# Patient Record
Sex: Female | Born: 1990 | Race: Black or African American | Hispanic: No | Marital: Married | Smoking: Never smoker
Health system: Southern US, Community
[De-identification: ages and names within clinical notes are randomized; demographics above are authoritative.]

## PROBLEM LIST (undated history)

## (undated) ENCOUNTER — Inpatient Hospital Stay (HOSPITAL_COMMUNITY): Payer: Self-pay

## (undated) DIAGNOSIS — K649 Unspecified hemorrhoids: Secondary | ICD-10-CM

## (undated) DIAGNOSIS — Z789 Other specified health status: Secondary | ICD-10-CM

## (undated) DIAGNOSIS — S0300XA Dislocation of jaw, unspecified side, initial encounter: Secondary | ICD-10-CM

## (undated) HISTORY — PX: NO PAST SURGERIES: SHX2092

---

## 2014-04-10 ENCOUNTER — Encounter (HOSPITAL_COMMUNITY): Payer: Self-pay | Admitting: *Deleted

## 2014-04-10 ENCOUNTER — Emergency Department (HOSPITAL_COMMUNITY)
Admission: EM | Admit: 2014-04-10 | Discharge: 2014-04-10 | Disposition: A | Discharge status: Home / Self Care | Payer: 59 | Source: Home / Self Care | Attending: Family Medicine | Admitting: Family Medicine

## 2014-04-10 DIAGNOSIS — K648 Other hemorrhoids: Secondary | ICD-10-CM

## 2014-04-10 DIAGNOSIS — M79604 Pain in right leg: Secondary | ICD-10-CM | POA: Diagnosis not present

## 2014-04-10 DIAGNOSIS — K644 Residual hemorrhoidal skin tags: Secondary | ICD-10-CM

## 2014-04-10 NOTE — ED Notes (Signed)
C/o R leg pain x 8 mos. off and on.  No known injury.  Also has pain in her rectum.

## 2014-04-10 NOTE — ED Provider Notes (Signed)
CSN: [id number]     Arrival date & time 04/10/14  1519 History   First MD Initiated Contact with Patient 04/10/14 1556     Chief Complaint  Patient presents with  . Foot Pain   (Consider location/radiation/quality/duration/timing/severity/associated sxs/prior Treatment) HPI Comments: Patient presents with her husband for evaluation of 8-10 months (internittent episodes) of pain that begins at her rectum and radiates to her right buttock and then travels the length of her right leg to her right foot. Denies known injury, exacerbating or alleviating factors, or attempts to treat at home. No previous evaluations. No PCP. Has lived in the [place]   The history is provided by the patient.    History reviewed. No pertinent past medical history. History reviewed. No pertinent past surgical history. History reviewed. No pertinent family history. History  Substance Use Topics  . Smoking status: Never Smoker   . Smokeless tobacco: Not on file  . Alcohol Use: No   OB History    No data available     Review of Systems  All other systems reviewed and are negative.   Allergies  Review of patient's allergies indicates no known allergies.  Home Medications   Prior to Admission medications   Not on File   BP 115/73 mmHg  Pulse 71  Temp(Src) 98.8 F (37.1 C) (Oral)  Resp 12  SpO2 99%  LMP 03/22/2014 Physical Exam  Constitutional: She is oriented to person, place, and time. She appears well-developed and well-nourished. No distress.  HENT:  Head: Normocephalic and atraumatic.  Eyes: Conjunctivae are normal.  Cardiovascular: Normal rate, regular rhythm and normal heart sounds.   Pulmonary/Chest: Effort normal and breath sounds normal.  Genitourinary:     Musculoskeletal: Normal range of motion.       Right hip: Normal.       Right knee: Normal.       Right ankle: Normal.       Lumbar back: Normal.       Right upper leg: Normal.       Right lower leg: Normal.   Right foot: Normal.  Neurological: She is alert and oriented to person, place, and time.  Skin: Skin is warm and dry.  Psychiatric: She has a normal mood and affect. Her behavior is normal.  Nursing note and vitals reviewed.   ED Course  Procedures (including critical care time) Labs Review Labs Reviewed - No data to display  Imaging Review No results found.   MDM   1. External hemorrhoid   2. Right leg pain    You have a small external hemorrhoid that does not appear to be worrisome. The remainder of your physical exam and vital signs are very reassuring. Please continue to monitor your symptoms at home and use either tylenol or ibuprofen as directed on packaging for pain. Locate the primary care doctor of your choice should symptoms continue or persist for additional evaluation.    Ria ClockJennifer Lee H Laurrie Toppin, GeorgiaPA 04/10/14 [phone]

## 2014-04-10 NOTE — Discharge Instructions (Signed)
You have a small external hemorrhoid that does not appear to be worrisome. The remainder of your physical exam and vital signs are very reassuring. Please continue to monitor your symptoms at home and use either tylenol or ibuprofen as directed on packaging for pain. Locate the primary care doctor of your choice should symptoms continue or persist for additional evaluation.  Hemorrhoids Hemorrhoids are swollen veins around the rectum or anus. There are two types of hemorrhoids:   Internal hemorrhoids. These occur in the veins just inside the rectum. They may poke through to the outside and become irritated and painful.  External hemorrhoids. These occur in the veins outside the anus and can be felt as a painful swelling or hard lump near the anus. CAUSES  Pregnancy.   Obesity.   Constipation or diarrhea.   Straining to have a bowel movement.   Sitting for long periods on the toilet.  Heavy lifting or other activity that caused you to strain.  Anal intercourse. SYMPTOMS   Pain.   Anal itching or irritation.   Rectal bleeding.   Fecal leakage.   Anal swelling.   One or more lumps around the anus.  DIAGNOSIS  Your caregiver may be able to diagnose hemorrhoids by visual examination. Other examinations or tests that may be performed include:   Examination of the rectal area with a gloved hand (digital rectal exam).   Examination of anal canal using a small tube (scope).   A blood test if you have lost a significant amount of blood.  A test to look inside the colon (sigmoidoscopy or colonoscopy). TREATMENT Most hemorrhoids can be treated at home. However, if symptoms do not seem to be getting better or if you have a lot of rectal bleeding, your caregiver may perform a procedure to help make the hemorrhoids get smaller or remove them completely. Possible treatments include:   Placing a rubber band at the base of the hemorrhoid to cut off the circulation (rubber  band ligation).   Injecting a chemical to shrink the hemorrhoid (sclerotherapy).   Using a tool to burn the hemorrhoid (infrared light therapy).   Surgically removing the hemorrhoid (hemorrhoidectomy).   Stapling the hemorrhoid to block blood flow to the tissue (hemorrhoid stapling).  HOME CARE INSTRUCTIONS   Eat foods with fiber, such as whole grains, beans, nuts, fruits, and vegetables. Ask your doctor about taking products with added fiber in them (fibersupplements).  Increase fluid intake. Drink enough water and fluids to keep your urine clear or pale yellow.   Exercise regularly.   Go to the bathroom when you have the urge to have a bowel movement. Do not wait.   Avoid straining to have bowel movements.   Keep the anal area dry and clean. Use wet toilet paper or moist towelettes after a bowel movement.   Medicated creams and suppositories may be used or applied as directed.   Only take over-the-counter or prescription medicines as directed by your caregiver.   Take warm sitz baths for 15-20 minutes, 3-4 times a day to ease pain and discomfort.   Place ice packs on the hemorrhoids if they are tender and swollen. Using ice packs between sitz baths may be helpful.   Put ice in a plastic bag.   Place a towel between your skin and the bag.   Leave the ice on for 15-20 minutes, 3-4 times a day.   Do not use a donut-shaped pillow or sit on the toilet for long periods. This  increases blood pooling and pain.  SEEK MEDICAL CARE IF:  You have increasing pain and swelling that is not controlled by treatment or medicine.  You have uncontrolled bleeding.  You have difficulty or you are unable to have a bowel movement.  You have pain or inflammation outside the area of the hemorrhoids. MAKE SURE YOU:  Understand these instructions.  Will watch your condition.  Will get help right away if you are not doing well or get worse. Document Released: 12/16/1999  Document Revised: 12/05/2011 Document Reviewed: 10/23/2011 [facility] Patient Information 2015 [place], [place]. This information is not intended to replace advice given to you by your health care provider. Make sure you discuss any questions you have with your health care provider.  Pain of Unknown Etiology (Pain Without a Known Cause) You have come to your caregiver because of pain. Pain can occur in any part of the body. Often there is not a definite cause. If your laboratory (blood or urine) work was normal and X-rays or other studies were normal, your caregiver may treat you without knowing the cause of the pain. An example of this is the headache. Most headaches are diagnosed by taking a history. This means your caregiver asks you questions about your headaches. Your caregiver determines a treatment based on your answers. Usually testing done for headaches is normal. Often testing is not done unless there is no response to medications. Regardless of where your pain is located today, you can be given medications to make you comfortable. If no physical cause of pain can be found, most cases of pain will gradually leave as suddenly as they came.  If you have a painful condition and no reason can be found for the pain, it is important that you follow up with your caregiver. If the pain becomes worse or does not go away, it may be necessary to repeat tests and look further for a possible cause.  Only take over-the-counter or prescription medicines for pain, discomfort, or fever as directed by your caregiver.  For the protection of your privacy, test results cannot be given over the phone. Make sure you receive the results of your test. Ask how these results are to be obtained if you have not been informed. It is your responsibility to obtain your test results.  You may continue all activities unless the activities cause more pain. When the pain lessens, it is important to gradually resume normal activities.  Resume activities by beginning slowly and gradually increasing the intensity and duration of the activities or exercise. During periods of severe pain, bed rest may be helpful. Lie or sit in any position that is comfortable.  Ice used for acute (sudden) conditions may be effective. Use a large plastic bag filled with ice and wrapped in a towel. This may provide pain relief.  See your caregiver for continued problems. Your caregiver can help or refer you for exercises or physical therapy if necessary. If you were given medications for your condition, do not drive, operate machinery or power tools, or sign legal documents for 24 hours. Do not drink alcohol, take sleeping pills, or take other medications that may interfere with treatment. See your caregiver immediately if you have pain that is becoming worse and not relieved by medications. Document Released: 09/12/2000 Document Revised: 10/08/2012 Document Reviewed: 12/18/2004 [facility] Patient Information 2015 [place], [place]. This information is not intended to replace advice given to you by your health care provider. Make sure you discuss any questions you have  with your health care provider. ° °

## 2014-04-14 ENCOUNTER — Ambulatory Visit (INDEPENDENT_AMBULATORY_CARE_PROVIDER_SITE_OTHER): Payer: 59 | Admitting: Physician Assistant

## 2014-04-14 VITALS — BP 110/68 | HR 79 | Temp 97.9°F | Resp 18 | Ht 61.0 in | Wt 135.0 lb

## 2014-04-14 DIAGNOSIS — M25561 Pain in right knee: Secondary | ICD-10-CM | POA: Diagnosis not present

## 2014-04-14 DIAGNOSIS — K649 Unspecified hemorrhoids: Secondary | ICD-10-CM

## 2014-04-14 MED ORDER — HYDROCORTISONE ACETATE 25 MG RE SUPP: 25.0000 mg | Freq: Two times a day (BID) | RECTAL | Status: DC | PRN

## 2014-04-14 MED ORDER — MELOXICAM 15 MG PO TABS: 15.0000 mg | ORAL_TABLET | Freq: Every day | ORAL | Status: DC | PRN

## 2014-04-14 NOTE — Patient Instructions (Signed)
Sciatica Sciatica is pain, weakness, numbness, or tingling along your sciatic nerve. The nerve starts in the lower back and runs down the back of each leg. Nerve damage or certain conditions pinch or put pressure on the sciatic nerve. This causes the pain, weakness, and other discomforts of sciatica. HOME CARE   Only take medicine as told by your doctor.  Apply ice to the affected area for 20 minutes. Do this 3-4 times a day for the first 48-72 hours. Then try heat in the same way.  Exercise, stretch, or do your usual activities if these do not make your pain worse.  Go to physical therapy as told by your doctor.  Keep all doctor visits as told.  Do not wear high heels or shoes that are not supportive.  Get a firm mattress if your mattress is too soft to lessen pain and discomfort. GET HELP RIGHT AWAY IF:   You cannot control when you poop (bowel movement) or pee (urinate).  You have more weakness in your lower back, lower belly (pelvis), butt (buttocks), or legs.  You have redness or puffiness (swelling) of your back.  You have a burning feeling when you pee.  You have pain that gets worse when you lie down.  You have pain that wakes you from your sleep.  Your pain is worse than past pain.  Your pain lasts longer than 4 weeks.  You are suddenly losing weight without reason. MAKE SURE YOU:   Understand these instructions.  Will watch this condition.  Will get help right away if you are not doing well or get worse. Document Released: 09/27/2007 Document Revised: 06/19/2011 Document Reviewed: 04/29/2011 [facility] Patient Information 2015 [place], [place]. This information is not intended to replace advice given to you by your health care provider. Make sure you discuss any questions you have with your health care provider. Hemorrhoids Hemorrhoids are puffy (swollen) veins around the rectum or anus. Hemorrhoids can cause pain, itching, bleeding, or irritation. HOME  CARE  Eat foods with fiber, such as whole grains, beans, nuts, fruits, and vegetables. Ask your doctor about taking products with added fiber in them (fibersupplements).  Drink enough fluid to keep your pee (urine) clear or pale yellow.  Exercise often.  Go to the bathroom when you have the urge to poop. Do not wait.  Avoid straining to poop (bowel movement).  Keep the butt area dry and clean. Use wet toilet paper or moist paper towels.  Medicated creams and medicine inserted into the anus (anal suppository) may be used or applied as told.  Only take medicine as told by your doctor.  Take a warm water bath (sitz bath) for 15-20 minutes to ease pain. Do this 3-4 times a day.  Place ice packs on the area if it is tender or puffy. Use the ice packs between the warm water baths.  Put ice in a plastic bag.  Place a towel between your skin and the bag.  Leave the ice on for 15-20 minutes, 03-04 times a day.  Do not use a donut-shaped pillow or sit on the toilet for a long time. GET HELP RIGHT AWAY IF:   You have more pain that is not controlled by treatment or medicine.  You have bleeding that will not stop.  You have trouble or are unable to poop (bowel movement).  You have pain or puffiness outside the area of the hemorrhoids. MAKE SURE YOU:   Understand these instructions.  Will watch your condition.  Will get help right away if you are not doing well or get worse. Document Released: 09/27/2007 Document Revised: 12/05/2011 Document Reviewed: 10/30/2011 [facility] Patient Information 2015 [place], [place]. This information is not intended to replace advice given to you by your health care provider. Make sure you discuss any questions you have with your health care provider.

## 2014-04-14 NOTE — Progress Notes (Signed)
Subjective:   Patient ID: Barth Kirks, female     DOB: 09/08/1990, 24 y.o.    MRN: [id number]  PCP: No primary care provider on file.  Chief Complaint  Patient presents with  . Hemorrhoids    8-9 mths now      Prior to Admission medications   Not on File     No Known Allergies   There are no active problems to display for this patient.    History reviewed. No pertinent family history.   History   Social History  . Marital Status: Married    Spouse Name: Shanekia Latella  . Number of Children: 1  . Years of Education: College   Occupational History  . stay-at-home mother    Social History Main Topics  . Smoking status: Never Smoker   . Smokeless tobacco: Never Used  . Alcohol Use: No  . Drug Use: No  . Sexual Activity:    Partners: Male    Birth Control/ Protection: None     Comment: trying to become pregnant (04/2014)   Other Topics Concern  . Not on file   Social History Narrative   From [place]. Arrived in the [place] 01/2014 with her infant daughter, to joint her husband, already living here.     HPI  Presents complaining of hemorrhoids and RIGHT leg pain. She is accompanied by her husband who helps translate, and their 106 month old daughter. She was seen for the same complaint at another facility on 4/09. That note is reviewed. She was diagnosed with a non-thrombosed hemorrhoid and advised that she needed no treatment. She wants to know what to do when she has pain, and also asks about the leg pain. The hemorrhoid has been present since the later part of her pregnancy. It is intermittently painful with sitting. She denies itching, pain with BM and bleeding with BM. The leg pain has been present longer, but she can't be more specific. It starts in the mid-buttock on the RIGHT and extends into the thigh and lower leg, ending just above the ankle. No weakness. No paresthesias. No loss of bowel or bladder control. No injury to the back or leg. Often worse at  night when she's lying down, but can also occur during the day. She's not sure which movements exacerbate the pain.  OTC ibuprofen resolves the pain.   Review of Systems Review of Systems  Constitutional: Negative for fever and chills.  Gastrointestinal: Positive for rectal pain (intermittently, with sitting). Negative for nausea, vomiting, abdominal pain, diarrhea, constipation, blood in stool and anal bleeding. Abdominal distention: no distension, but complains of having too much gas.  Genitourinary: Negative for dysuria, urgency and frequency.  Musculoskeletal: Negative for back pain, joint swelling, arthralgias, gait problem, neck pain and neck stiffness. Myalgias: RIGHT leg pain.  Skin: Negative for rash.  Neurological: Negative for tremors, weakness and numbness.        Objective:  Physical Exam Physical Exam  Constitutional: She is oriented to person, place, and time. She appears well-developed and well-nourished. No distress.  BP 110/68 mmHg  Pulse 79  Temp(Src) 97.9 F (36.6 C) (Oral)  Resp 18  Ht  (1.549 m)  Wt 135 lb (61.236 kg)  BMI 25.52 kg/m2  SpO2 99%  LMP 03/22/2014   Eyes: Conjunctivae are normal. No scleral icterus.  Neck: Neck supple. No thyromegaly present.  Cardiovascular: Normal rate, regular rhythm, normal heart sounds and intact distal pulses.   Pulmonary/Chest: Effort normal and breath  sounds normal.  Lymphadenopathy:    She has no cervical adenopathy.  Neurological: She is alert and oriented to person, place, and time.  Skin: Skin is warm and dry.  Psychiatric: She has a normal mood and affect. Her behavior is normal.             Assessment & Plan:  1. Hemorrhoids, unspecified hemorrhoid type Counseled on the generally benign nature of hemorrhoids, keeping the stool soft, avoiding sitting on the toilet for extended periods. Anticipatory guidance regarding painful or bleeding hemorrhoids, should that occur. Because she sometimes has  pain with sitting, Anusol HC PRN. - hydrocortisone (ANUSOL-HC) 25 MG suppository; Place 1 suppository (25 mg total) rectally 2 (two) times daily as needed for hemorrhoids or itching.  Dispense: 12 suppository; Refill: 1  2. Pain in joint, lower leg, right This sounds like sciatica. She's not having any back pain, and no concerning symptoms. She wants something prescription to take instead of ibuprofen. Trial of Meloxicam. - meloxicam (MOBIC) 15 MG tablet; Take 1 tablet (15 mg total) by mouth daily as needed for pain.  Dispense: 30 tablet; Refill: 1   Fernande Brashelle S. Nakhi Choi, PA-C Physician Assistant-Certified [facility]

## 2014-07-14 ENCOUNTER — Ambulatory Visit (INDEPENDENT_AMBULATORY_CARE_PROVIDER_SITE_OTHER): Payer: 59 | Admitting: Family Medicine

## 2014-07-14 VITALS — BP 118/62 | HR 74 | Temp 98.7°F | Resp 16 | Ht 60.0 in | Wt 132.8 lb

## 2014-07-14 DIAGNOSIS — N912 Amenorrhea, unspecified: Secondary | ICD-10-CM

## 2014-07-14 DIAGNOSIS — Z3490 Encounter for supervision of normal pregnancy, unspecified, unspecified trimester: Secondary | ICD-10-CM

## 2014-07-14 LAB — POCT URINE PREGNANCY: Preg Test, Ur: POSITIVE — AB

## 2014-07-14 NOTE — Patient Instructions (Signed)
First Trimester of Pregnancy The first trimester of pregnancy is from week 1 until the end of week 12 (months 1 through 3). A week after a sperm fertilizes an egg, the egg will implant on the wall of the uterus. This embryo will begin to develop into a baby. Genes from you and your partner are forming the baby. The female genes determine whether the baby is a boy or a girl. At 6-8 weeks, the eyes and face are formed, and the heartbeat can be seen on ultrasound. At the end of 12 weeks, all the baby's organs are formed.  Now that you are pregnant, you will want to do everything you can to have a healthy baby. Two of the most important things are to get good prenatal care and to follow your health care provider's instructions. Prenatal care is all the medical care you receive before the baby's birth. This care will help prevent, find, and treat any problems during the pregnancy and childbirth. BODY CHANGES Your body goes through many changes during pregnancy. The changes vary from woman to woman.   You may gain or lose a couple of pounds at first.  You may feel sick to your stomach (nauseous) and throw up (vomit). If the vomiting is uncontrollable, call your health care provider.  You may tire easily.  You may develop headaches that can be relieved by medicines approved by your health care provider.  You may urinate more often. Painful urination may mean you have a bladder infection.  You may develop heartburn as a result of your pregnancy.  You may develop constipation because certain hormones are causing the muscles that push waste through your intestines to slow down.  You may develop hemorrhoids or swollen, bulging veins (varicose veins).  Your breasts may begin to grow larger and become tender. Your nipples may stick out more, and the tissue that surrounds them (areola) may become darker.  Your gums may bleed and may be sensitive to brushing and flossing.  Dark spots or blotches (chloasma,  mask of pregnancy) may develop on your face. This will likely fade after the baby is born.  Your menstrual periods will stop.  You may have a loss of appetite.  You may develop cravings for certain kinds of food.  You may have changes in your emotions from day to day, such as being excited to be pregnant or being concerned that something may go wrong with the pregnancy and baby.  You may have more vivid and strange dreams.  You may have changes in your hair. These can include thickening of your hair, rapid growth, and changes in texture. Some women also have hair loss during or after pregnancy, or hair that feels dry or thin. Your hair will most likely return to normal after your baby is born. WHAT TO EXPECT AT YOUR PRENATAL VISITS During a routine prenatal visit:  You will be weighed to make sure you and the baby are growing normally.  Your blood pressure will be taken.  Your abdomen will be measured to track your baby's growth.  The fetal heartbeat will be listened to starting around week 10 or 12 of your pregnancy.  Test results from any previous visits will be discussed. Your health care provider may ask you:  How you are feeling.  If you are feeling the baby move.  If you have had any abnormal symptoms, such as leaking fluid, bleeding, severe headaches, or abdominal cramping.  If you have any questions. Other tests   that may be performed during your first trimester include:  Blood tests to find your blood type and to check for the presence of any previous infections. They will also be used to check for low iron levels (anemia) and Rh antibodies. Later in the pregnancy, blood tests for diabetes will be done along with other tests if problems develop.  Urine tests to check for infections, diabetes, or protein in the urine.  An ultrasound to confirm the proper growth and development of the baby.  An amniocentesis to check for possible genetic problems.  Fetal screens for  spina bifida and Down syndrome.  You may need other tests to make sure you and the baby are doing well. HOME CARE INSTRUCTIONS  Medicines  Follow your health care provider's instructions regarding medicine use. Specific medicines may be either safe or unsafe to take during pregnancy.  Take your prenatal vitamins as directed.  If you develop constipation, try taking a stool softener if your health care provider approves. Diet  Eat regular, well-balanced meals. Choose a variety of foods, such as meat or vegetable-based protein, fish, milk and low-fat dairy products, vegetables, fruits, and whole grain breads and cereals. Your health care provider will help you determine the amount of weight gain that is right for you.  Avoid raw meat and uncooked cheese. These carry germs that can cause birth defects in the baby.  Eating four or five small meals rather than three large meals a day may help relieve nausea and vomiting. If you start to feel nauseous, eating a few soda crackers can be helpful. Drinking liquids between meals instead of during meals also seems to help nausea and vomiting.  If you develop constipation, eat more high-fiber foods, such as fresh vegetables or fruit and whole grains. Drink enough fluids to keep your urine clear or pale yellow. Activity and Exercise  Exercise only as directed by your health care provider. Exercising will help you:  Control your weight.  Stay in shape.  Be prepared for labor and delivery.  Experiencing pain or cramping in the lower abdomen or low back is a good sign that you should stop exercising. Check with your health care provider before continuing normal exercises.  Try to avoid standing for long periods of time. Move your legs often if you must stand in one place for a long time.  Avoid heavy lifting.  Wear low-heeled shoes, and practice good posture.  You may continue to have sex unless your health care provider directs you  otherwise. Relief of Pain or Discomfort  Wear a good support bra for breast tenderness.   Take warm sitz baths to soothe any pain or discomfort caused by hemorrhoids. Use hemorrhoid cream if your health care provider approves.   Rest with your legs elevated if you have leg cramps or low back pain.  If you develop varicose veins in your legs, wear support hose. Elevate your feet for 15 minutes, 3-4 times a day. Limit salt in your diet. Prenatal Care  Schedule your prenatal visits by the twelfth week of pregnancy. They are usually scheduled monthly at first, then more often in the last 2 months before delivery.  Write down your questions. Take them to your prenatal visits.  Keep all your prenatal visits as directed by your health care provider. Safety  Wear your seat belt at all times when driving.  Make a list of emergency phone numbers, including numbers for family, friends, the hospital, and police and fire departments. General Tips    Ask your health care provider for a referral to a local prenatal education class. Begin classes no later than at the beginning of month 6 of your pregnancy.  Ask for help if you have counseling or nutritional needs during pregnancy. Your health care provider can offer advice or refer you to specialists for help with various needs.  Do not use hot tubs, steam rooms, or saunas.  Do not douche or use tampons or scented sanitary pads.  Do not cross your legs for long periods of time.  Avoid cat litter boxes and soil used by cats. These carry germs that can cause birth defects in the baby and possibly loss of the fetus by miscarriage or stillbirth.  Avoid all smoking, herbs, alcohol, and medicines not prescribed by your health care provider. Chemicals in these affect the formation and growth of the baby.  Schedule a dentist appointment. At home, brush your teeth with a soft toothbrush and be gentle when you floss. SEEK MEDICAL CARE IF:   You have  dizziness.  You have mild pelvic cramps, pelvic pressure, or nagging pain in the abdominal area.  You have persistent nausea, vomiting, or diarrhea.  You have a bad smelling vaginal discharge.  You have pain with urination.  You notice increased swelling in your face, hands, legs, or ankles. SEEK IMMEDIATE MEDICAL CARE IF:   You have a fever.  You are leaking fluid from your vagina.  You have spotting or bleeding from your vagina.  You have severe abdominal cramping or pain.  You have rapid weight gain or loss.  You vomit blood or material that looks like coffee grounds.  You are exposed to German measles and have never had them.  You are exposed to fifth disease or chickenpox.  You develop a severe headache.  You have shortness of breath.  You have any kind of trauma, such as from a fall or a car accident. Document Released: 12/12/2000 Document Revised: 05/04/2013 Document Reviewed: 10/28/2012 Exit[employer] Patient Information 2015 [employer]. This information is not intended to replace advice given to you by your health care provider. Make sure you discuss any questions you have with your health care provider.  

## 2014-07-14 NOTE — Progress Notes (Signed)
      Chief Complaint:  Chief Complaint  Patient presents with  . Pregnancy test    HPI: Dawn Torres is a 24 y.o. female [facility] complaining of lack of menstrual cycle since 05/07/1998 616. She has a 688-month-old and is breast-feeding. She is not on any birth control. She is G2PL1.   No past medical history on file. No past surgical history on file. History   Social History  . Marital Status: Married    Spouse Name: Gwenette Greetshag Pelzel  . Number of Children: 1  . Years of Education: College   Occupational History  . stay-at-home mother    Social History Main Topics  . Smoking status: Never Smoker   . Smokeless tobacco: Never Used  . Alcohol Use: No  . Drug Use: No  . Sexual Activity:    Partners: Male    Birth Control/ Protection: None     Comment: trying to become pregnant (04/2014)   Other Topics Concern  . None   Social History Narrative   From [place]. [place] in the [place] 01[place]2014 with her infant daughter, to join her husband, already living here.   No family history on file. No Known Allergies Prior to Admission medications   Not on File     ROS: The patient denies fevers, chills, night sweats, unintentional weight loss, chest pain, palpitations, wheezing, dyspnea on exertion, nausea, vomiting, abdominal pain, dysuria, hematuria, melena, numbness, weakness, or tingling.   All other systems have been reviewed and were otherwise negative with the exception of those mentioned in the HPI and as above.    PHYSICAL EXAM: Filed Vitals:   07/14/14 1216  BP: 118/62  Pulse: 74  Temp: 98.7 F (37.1 C)  Resp: 16   Body mass index is 25.94 kg/(m^2).   General: Alert, no acute distress HEENT:  Normocephalic, atraumatic, oropharynx patent. EOMI, PERRLA Cardiovascular:  Regular rate and rhythm, no rubs murmurs or gallops.  No Carotid bruits, radial pulse intact. No pedal edema.  Respiratory: Clear to auscultation bilaterally.  No wheezes, rales, or  rhonchi.  No cyanosis, no use of accessory musculature Abdominal: No organomegaly, abdomen is soft and non-tender, positive bowel sounds. No masses. Skin: No rashes. Neurologic: Facial musculature symmetric. Psychiatric: Patient acts appropriately throughout our interaction. Lymphatic: No cervical or submandibular lymphadenopathy Musculoskeletal: Gait intact. No edema, tenderness   LABS: Results for orders placed or performed in visit on 07/14/14  POCT urine pregnancy  Result Value Ref Range   Preg Test, Ur Positive (A) Negative     EKG/XRAY:   Primary read [facility]   ASSESSMENT/PLAN: Encounter Diagnoses  Name Primary?  . Amenorrhea   . Pregnant Yes   G2L1 at 9 weeks based on LMP 05/07/14 Advise to take prenatal vitamins Referred to /Fu with ob gyn   Gross sideeffects, risk and benefits, and alternatives of medications d/w patient. Patient is aware that all medications have potential sideeffects and we are unable to predict every sideeffect or drug-drug interaction that may occur.  Naseem Adler DO  07/14/2014 1:13 PM

## 2014-07-27 LAB — OB RESULTS CONSOLE HEPATITIS B SURFACE ANTIGEN: Hepatitis B Surface Ag: NEGATIVE

## 2014-07-27 LAB — OB RESULTS CONSOLE ABO/RH: RH Type: POSITIVE

## 2014-07-27 LAB — OB RESULTS CONSOLE HIV ANTIBODY (ROUTINE TESTING): HIV: NONREACTIVE

## 2014-07-27 LAB — OB RESULTS CONSOLE RPR: RPR: NONREACTIVE

## 2014-07-27 LAB — OB RESULTS CONSOLE GC/CHLAMYDIA
CHLAMYDIA, DNA PROBE: NEGATIVE
GC PROBE AMP, GENITAL: NEGATIVE

## 2014-07-27 LAB — OB RESULTS CONSOLE RUBELLA ANTIBODY, IGM: RUBELLA: IMMUNE

## 2014-09-22 ENCOUNTER — Other Ambulatory Visit (HOSPITAL_COMMUNITY): Payer: Self-pay | Admitting: Family Medicine

## 2014-09-22 DIAGNOSIS — O283 Abnormal ultrasonic finding on antenatal screening of mother: Secondary | ICD-10-CM

## 2014-09-22 DIAGNOSIS — Z3A21 21 weeks gestation of pregnancy: Secondary | ICD-10-CM

## 2014-10-01 ENCOUNTER — Encounter (HOSPITAL_COMMUNITY): Payer: Self-pay

## 2014-10-01 ENCOUNTER — Ambulatory Visit (HOSPITAL_COMMUNITY)
Admission: RE | Admit: 2014-10-01 | Discharge: 2014-10-01 | Disposition: A | Discharge status: Home / Self Care | Payer: 59 | Source: Ambulatory Visit | Attending: Family Medicine | Admitting: Family Medicine

## 2014-10-01 DIAGNOSIS — O283 Abnormal ultrasonic finding on antenatal screening of mother: Secondary | ICD-10-CM | POA: Diagnosis present

## 2014-10-01 DIAGNOSIS — Z3A21 21 weeks gestation of pregnancy: Secondary | ICD-10-CM | POA: Insufficient documentation

## 2014-10-01 HISTORY — DX: Other specified health status: Z78.9

## 2014-10-12 ENCOUNTER — Encounter (HOSPITAL_COMMUNITY): Payer: Self-pay | Admitting: Family Medicine

## 2014-10-12 ENCOUNTER — Other Ambulatory Visit (HOSPITAL_COMMUNITY): Payer: Self-pay | Admitting: Family Medicine

## 2014-11-12 ENCOUNTER — Other Ambulatory Visit (HOSPITAL_COMMUNITY): Payer: Self-pay | Admitting: Maternal and Fetal Medicine

## 2014-11-12 ENCOUNTER — Ambulatory Visit (HOSPITAL_COMMUNITY): Admission: RE | Admit: 2014-11-12 | Payer: 59 | Source: Ambulatory Visit

## 2014-11-12 ENCOUNTER — Encounter (HOSPITAL_COMMUNITY): Payer: Self-pay

## 2014-11-12 DIAGNOSIS — O283 Abnormal ultrasonic finding on antenatal screening of mother: Secondary | ICD-10-CM

## 2014-11-12 DIAGNOSIS — Z3A27 27 weeks gestation of pregnancy: Secondary | ICD-10-CM

## 2014-12-15 ENCOUNTER — Emergency Department (INDEPENDENT_AMBULATORY_CARE_PROVIDER_SITE_OTHER)
Admission: EM | Admit: 2014-12-15 | Discharge: 2014-12-15 | Disposition: A | Discharge status: Home / Self Care | Payer: Medicaid Other | Source: Home / Self Care | Attending: Family Medicine | Admitting: Family Medicine

## 2014-12-15 ENCOUNTER — Encounter (HOSPITAL_COMMUNITY): Payer: Self-pay | Admitting: *Deleted

## 2014-12-15 DIAGNOSIS — J069 Acute upper respiratory infection, unspecified: Secondary | ICD-10-CM

## 2014-12-15 MED ORDER — IPRATROPIUM BROMIDE 0.06 % NA SOLN: 2.0000 | Freq: Four times a day (QID) | NASAL | Status: DC

## 2014-12-15 NOTE — Discharge Instructions (Signed)
Drink plenty of fluids as discussed, use medicine as prescribed, and mucinex or delsym for cough. Return or see your doctor if further problems °

## 2014-12-15 NOTE — ED Provider Notes (Signed)
CSN: [id number]     Arrival date & time 12/15/14  1930 History   First MD Initiated Contact with Patient 12/15/14 2014     Chief Complaint  Patient presents with  . URI   (Consider location/radiation/quality/duration/timing/severity/associated sxs/prior Treatment) Patient is a 24 y.o. female presenting with URI. The history is provided by the patient.  URI Presenting symptoms: congestion, cough and rhinorrhea   Presenting symptoms: no fever and no sore throat   Severity:  Mild Onset quality:  Gradual Duration:  2 days Progression:  Unchanged Chronicity:  New Relieved by:  None tried Worsened by:  Nothing tried Ineffective treatments:  None tried Risk factors comment:  30 wk preg   Past Medical History  Diagnosis Date  . Medical history non-contributory    Past Surgical History  Procedure Laterality Date  . No past surgeries     History reviewed. No pertinent family history. Social History  Substance Use Topics  . Smoking status: Never Smoker   . Smokeless tobacco: Never Used  . Alcohol Use: No   OB History    Gravida Para Term Preterm AB TAB SAB Ectopic Multiple Living   2 1 1  0 0 0 0 0 0 1     Review of Systems  Constitutional: Negative.  Negative for fever.  HENT: Positive for congestion, postnasal drip and rhinorrhea. Negative for sore throat.   Respiratory: Positive for cough.   Cardiovascular: Negative.   All other systems reviewed and are negative.   Allergies  Review of patient's allergies indicates no known allergies.  Home Medications   Prior to Admission medications   Medication Sig Start Date End Date Taking? Authorizing Provider  ipratropium (ATROVENT) 0.06 % nasal spray Place 2 sprays into both nostrils 4 (four) times daily. 12/15/14   Linna HoffJames D Linden Tagliaferro, MD  Prenatal Vit-Fe Fumarate-FA (PRENATAL VITAMIN PO) Take by mouth.    Historical Provider, MD   Meds Ordered and Administered this Visit  Medications - No data to display  BP 115/77 mmHg   Pulse 106  Temp(Src) 99.1 F (37.3 C) (Oral)  Resp 16  SpO2 97%  LMP 05/07/2014 No data found.   Physical Exam  Constitutional: She is oriented to person, place, and time. She appears well-developed and well-nourished. No distress.  HENT:  Right Ear: External ear normal.  Left Ear: External ear normal.  Mouth/Throat: Oropharynx is clear and moist.  Neck: Normal range of motion. Neck supple.  Cardiovascular: Regular rhythm.   Pulmonary/Chest: Effort normal and breath sounds normal.  Lymphadenopathy:    She has no cervical adenopathy.  Neurological: She is alert and oriented to person, place, and time.  Skin: Skin is warm and dry.  Nursing note and vitals reviewed.   ED Course  Procedures (including critical care time)  Labs Review Labs Reviewed - No data to display  Imaging Review No results found.   Visual Acuity Review  Right Eye Distance:   Left Eye Distance:   Bilateral Distance:    Right Eye Near:   Left Eye Near:    Bilateral Near:         MDM   1. URI (upper respiratory infection)        Linna HoffJames D Tatjana Turcott, MD 12/15/14 2046

## 2014-12-15 NOTE — ED Notes (Signed)
Pt  Reports      Symptoms  Of  Cough   Congested         Sinus  Drainage        Pt    Is       [redacted]  Weeks  Pregnant           Symptoms  Not  releived  By  otc  mucinex

## 2015-01-02 NOTE — L&D Delivery Note (Signed)
Delivery Note At 4:39 PM a viable female, "[facility]", was delivered via Vaginal, Spontaneous Delivery (Presentation: ; Occiput Anterior).  APGAR: 9, 9; weight  .   Placenta status: Intact, Spontaneous.  Cord: 3 vessels with the following complications: None.  Cord pH: NA  Anesthesia: Epidural  Episiotomy: Median, due to patient difficulty with pushing in latter 2nd stage. Lacerations: None Suture Repair: 3.0 vicryl Est. Blood Loss (mL):  254  Mom to postpartum.  Baby to Couplet care / Skin to Skin. Family desires outpatient circumcision. Patient undecided regarding contraception.  Nigel Bridgeman 02/10/2015, 5:05 PM

## 2015-01-17 LAB — OB RESULTS CONSOLE GBS: GBS: NEGATIVE

## 2015-02-07 ENCOUNTER — Inpatient Hospital Stay (HOSPITAL_COMMUNITY)
Admission: AD | Admit: 2015-02-07 | Discharge: 2015-02-07 | Disposition: A | Discharge status: Home / Self Care | Payer: Medicaid Other | Source: Ambulatory Visit | Attending: Obstetrics & Gynecology | Admitting: Obstetrics & Gynecology

## 2015-02-07 ENCOUNTER — Encounter (HOSPITAL_COMMUNITY): Payer: Self-pay | Admitting: *Deleted

## 2015-02-07 DIAGNOSIS — N898 Other specified noninflammatory disorders of vagina: Secondary | ICD-10-CM | POA: Insufficient documentation

## 2015-02-07 DIAGNOSIS — M549 Dorsalgia, unspecified: Secondary | ICD-10-CM | POA: Insufficient documentation

## 2015-02-07 DIAGNOSIS — O99891 Other specified diseases and conditions complicating pregnancy: Secondary | ICD-10-CM

## 2015-02-07 DIAGNOSIS — O26893 Other specified pregnancy related conditions, third trimester: Secondary | ICD-10-CM | POA: Diagnosis not present

## 2015-02-07 DIAGNOSIS — Z3A39 39 weeks gestation of pregnancy: Secondary | ICD-10-CM | POA: Diagnosis not present

## 2015-02-07 DIAGNOSIS — O479 False labor, unspecified: Secondary | ICD-10-CM

## 2015-02-07 DIAGNOSIS — O9989 Other specified diseases and conditions complicating pregnancy, childbirth and the puerperium: Secondary | ICD-10-CM

## 2015-02-07 DIAGNOSIS — N939 Abnormal uterine and vaginal bleeding, unspecified: Secondary | ICD-10-CM | POA: Diagnosis present

## 2015-02-07 LAB — URINALYSIS, ROUTINE W REFLEX MICROSCOPIC
BILIRUBIN URINE: NEGATIVE
GLUCOSE, UA: NEGATIVE mg/dL
Hgb urine dipstick: NEGATIVE
KETONES UR: NEGATIVE mg/dL
LEUKOCYTES UA: NEGATIVE
Nitrite: NEGATIVE
PH: 5.5 (ref 5.0–8.0)
PROTEIN: NEGATIVE mg/dL
Specific Gravity, Urine: <1.005 — ABNORMAL LOW (ref 1.005–1.030)

## 2015-02-07 NOTE — MAU Note (Signed)
Urine sent to lab @2130 

## 2015-02-07 NOTE — MAU Note (Signed)
Pt states she had a one inch piece of something yellow/brown come out of her vagina three times since yesterday. Denies any leaking or vag bleeding. C/O low back pain for past two days, intermittent low abd pain and upper thigh pain .  Reports baby moving well.

## 2015-02-07 NOTE — MAU Provider Note (Signed)
History    Dawn Torres is a 25 y.o. G2P1001 at 39.3wks who presents, unannounced, for vaginal discharge and back pain.  Patient is non-english speaking and husband reports she had "strip of brownish yellow discharge" from her vagina.  No reports of VB or LOF, while endorsement of fetal movement given.  Patient husband reports patient has been having back pain for days and contractions since office visit.  Husband also reports patient was 2cm in office.  Per office note, patient 3/80/0 and had her membranes swept.  No complaints at that time.   There are no active problems to display for this patient.   No chief complaint on file.  HPI  OB History    Gravida Para Term Preterm AB TAB SAB Ectopic Multiple Living   0 0 0 0 0 0 1      Past Medical History  Diagnosis Date  . Medical history non-contributory     Past Surgical History  Procedure Laterality Date  . No past surgeries      History reviewed. No pertinent family history.  Social History  Substance Use Topics  . Smoking status: Never Smoker   . Smokeless tobacco: Never Used  . Alcohol Use: No    Allergies: No Known Allergies  Prescriptions prior to admission  Medication Sig Dispense Refill Last Dose  . Prenatal Vit-Fe Fumarate-FA (PRENATAL MULTIVITAMIN) TABS tablet Take 1 tablet by mouth daily at 12 noon.   02/06/2015 at Unknown time  . ipratropium (ATROVENT) 0.06 % nasal spray Place 2 sprays into both nostrils 4 (four) times daily. (Patient not taking: Reported on 02/07/2015) 15 mL 1 Not Taking at Unknown time    ROS  See HPI Above Physical Exam   Last menstrual period 05/07/2014.  No results found for this or any previous visit (from the past 24 hour(s)).  Physical Exam SVE: 3/70/-2 Apparent mucoid discharge consistent with mucous plug  FHR:125 bpm, Mod Var, -Decels, +Accels UC: Q2-75min, palpates mild to moderate ED Course  Assessment: IUP at 39.3wks Cat I FT Contractions Back Pain Vaginal  Discharge  Plan: -Discussed mucous discharge and normalcy in pregnancy -Patient denies need for pain medication regarding back pain -Given Early Labor Options:  A: Home with or w/o therapeutic rest B: Ambulate and reassess C: Pain medication and reassess -Patient undecided -Instructed to communicate decision to nurse -Give water for oral hydration  Follow Up (2222) -Nurse call reports patient desires to go home -Discharge to home as requested -Nurse instructed to give labor precautions and office number for provider contact  Cherre Robins CNM, MSN 02/07/2015 10:17 PM

## 2015-02-07 NOTE — MAU Note (Signed)
Language Line Used ID: [phone]

## 2015-02-10 ENCOUNTER — Inpatient Hospital Stay (HOSPITAL_COMMUNITY): Payer: Medicaid Other | Admitting: Anesthesiology

## 2015-02-10 ENCOUNTER — Encounter (HOSPITAL_COMMUNITY): Payer: Self-pay | Admitting: *Deleted

## 2015-02-10 ENCOUNTER — Inpatient Hospital Stay (HOSPITAL_COMMUNITY)
Admission: AD | Admit: 2015-02-10 | Discharge: 2015-02-12 | DRG: 775 | Disposition: A | Discharge status: Home / Self Care | Payer: Medicaid Other | Source: Ambulatory Visit | Attending: Obstetrics and Gynecology | Admitting: Obstetrics and Gynecology

## 2015-02-10 DIAGNOSIS — O9081 Anemia of the puerperium: Principal | ICD-10-CM | POA: Diagnosis not present

## 2015-02-10 DIAGNOSIS — Z789 Other specified health status: Secondary | ICD-10-CM | POA: Diagnosis present

## 2015-02-10 DIAGNOSIS — D649 Anemia, unspecified: Secondary | ICD-10-CM

## 2015-02-10 DIAGNOSIS — Z758 Other problems related to medical facilities and other health care: Secondary | ICD-10-CM | POA: Diagnosis present

## 2015-02-10 DIAGNOSIS — Z3A39 39 weeks gestation of pregnancy: Secondary | ICD-10-CM

## 2015-02-10 LAB — CBC
HEMATOCRIT: 34.9 % — AB (ref 36.0–46.0)
HEMOGLOBIN: 11.1 g/dL — AB (ref 12.0–15.0)
MCH: 24.9 pg — ABNORMAL LOW (ref 26.0–34.0)
MCHC: 31.8 g/dL (ref 30.0–36.0)
MCV: 78.3 fL (ref 78.0–100.0)
Platelets: 156 10*3/uL (ref 150–400)
RBC: 4.46 MIL/uL (ref 3.87–5.11)
RDW: 16.6 % — ABNORMAL HIGH (ref 11.5–15.5)
WBC: 5.3 10*3/uL (ref 4.0–10.5)

## 2015-02-10 LAB — TYPE AND SCREEN
ABO/RH(D): B POS
ANTIBODY SCREEN: NEGATIVE

## 2015-02-10 LAB — ABO/RH: ABO/RH(D): B POS

## 2015-02-10 MED ORDER — PHENYLEPHRINE 40 MCG/ML (10ML) SYRINGE FOR IV PUSH (FOR BLOOD PRESSURE SUPPORT)
80.0000 ug | PREFILLED_SYRINGE | INTRAVENOUS | Status: DC | PRN
  Administered 2015-02-10 (×2): 80 ug via INTRAVENOUS
  Filled 2015-02-10: qty 2

## 2015-02-10 MED ORDER — ONDANSETRON HCL 4 MG/2ML IJ SOLN: 4.0000 mg | INTRAMUSCULAR | Status: DC | PRN

## 2015-02-10 MED ORDER — PHENYLEPHRINE 40 MCG/ML (10ML) SYRINGE FOR IV PUSH (FOR BLOOD PRESSURE SUPPORT)
PREFILLED_SYRINGE | INTRAVENOUS | Status: AC
  Filled 2015-02-10: qty 20

## 2015-02-10 MED ORDER — SIMETHICONE 80 MG PO CHEW: 80.0000 mg | CHEWABLE_TABLET | ORAL | Status: DC | PRN

## 2015-02-10 MED ORDER — TETANUS-DIPHTH-ACELL PERTUSSIS 5-2.5-18.5 LF-MCG/0.5 IM SUSP: 0.5000 mL | Freq: Once | INTRAMUSCULAR | Status: DC

## 2015-02-10 MED ORDER — FENTANYL CITRATE (PF) 100 MCG/2ML IJ SOLN
100.0000 ug | INTRAMUSCULAR | Status: DC | PRN
  Administered 2015-02-10: 100 ug via INTRAVENOUS
  Filled 2015-02-10: qty 2

## 2015-02-10 MED ORDER — ONDANSETRON HCL 4 MG/2ML IJ SOLN: 4.0000 mg | Freq: Four times a day (QID) | INTRAMUSCULAR | Status: DC | PRN

## 2015-02-10 MED ORDER — EPHEDRINE 5 MG/ML INJ
10.0000 mg | INTRAVENOUS | Status: DC | PRN
  Filled 2015-02-10: qty 2

## 2015-02-10 MED ORDER — LIDOCAINE HCL (PF) 1 % IJ SOLN
30.0000 mL | INTRAMUSCULAR | Status: DC | PRN
  Administered 2015-02-10: 30 mL via SUBCUTANEOUS
  Filled 2015-02-10: qty 30

## 2015-02-10 MED ORDER — ONDANSETRON HCL 4 MG PO TABS: 4.0000 mg | ORAL_TABLET | ORAL | Status: DC | PRN

## 2015-02-10 MED ORDER — DIPHENHYDRAMINE HCL 25 MG PO CAPS: 25.0000 mg | ORAL_CAPSULE | Freq: Four times a day (QID) | ORAL | Status: DC | PRN

## 2015-02-10 MED ORDER — OXYCODONE HCL 5 MG PO TABS
5.0000 mg | ORAL_TABLET | ORAL | Status: DC | PRN
  Administered 2015-02-11 (×2): 5 mg via ORAL
  Filled 2015-02-10 (×2): qty 1

## 2015-02-10 MED ORDER — OXYCODONE-ACETAMINOPHEN 5-325 MG PO TABS: 1.0000 | ORAL_TABLET | ORAL | Status: DC | PRN

## 2015-02-10 MED ORDER — FENTANYL 2.5 MCG/ML BUPIVACAINE 1/10 % EPIDURAL INFUSION (WH - ANES)
14.0000 mL/h | INTRAMUSCULAR | Status: DC | PRN
  Administered 2015-02-10: 14 mL/h via EPIDURAL

## 2015-02-10 MED ORDER — SENNOSIDES-DOCUSATE SODIUM 8.6-50 MG PO TABS
2.0000 | ORAL_TABLET | ORAL | Status: DC
  Administered 2015-02-10 – 2015-02-11 (×2): 2 via ORAL
  Filled 2015-02-10 (×2): qty 2

## 2015-02-10 MED ORDER — FLEET ENEMA 7-19 GM/118ML RE ENEM: 1.0000 | ENEMA | RECTAL | Status: DC | PRN

## 2015-02-10 MED ORDER — LACTATED RINGERS IV SOLN
INTRAVENOUS | Status: DC
  Administered 2015-02-10 (×2): via INTRAVENOUS

## 2015-02-10 MED ORDER — PRENATAL MULTIVITAMIN CH
1.0000 | ORAL_TABLET | Freq: Every day | ORAL | Status: DC
  Administered 2015-02-11 – 2015-02-12 (×2): 1 via ORAL
  Filled 2015-02-10 (×2): qty 1

## 2015-02-10 MED ORDER — LACTATED RINGERS IV SOLN
2.5000 [IU]/h | INTRAVENOUS | Status: DC
  Filled 2015-02-10: qty 4

## 2015-02-10 MED ORDER — IBUPROFEN 600 MG PO TABS
600.0000 mg | ORAL_TABLET | Freq: Four times a day (QID) | ORAL | Status: DC
  Administered 2015-02-10 – 2015-02-12 (×7): 600 mg via ORAL
  Filled 2015-02-10 (×7): qty 1

## 2015-02-10 MED ORDER — BENZOCAINE-MENTHOL 20-0.5 % EX AERO: 1.0000 "application " | INHALATION_SPRAY | CUTANEOUS | Status: DC | PRN

## 2015-02-10 MED ORDER — LACTATED RINGERS IV SOLN: 500.0000 mL | INTRAVENOUS | Status: DC | PRN

## 2015-02-10 MED ORDER — DIBUCAINE 1 % RE OINT: 1.0000 "application " | TOPICAL_OINTMENT | RECTAL | Status: DC | PRN

## 2015-02-10 MED ORDER — ACETAMINOPHEN 325 MG PO TABS: 650.0000 mg | ORAL_TABLET | ORAL | Status: DC | PRN

## 2015-02-10 MED ORDER — OXYCODONE-ACETAMINOPHEN 5-325 MG PO TABS: 2.0000 | ORAL_TABLET | ORAL | Status: DC | PRN

## 2015-02-10 MED ORDER — WITCH HAZEL-GLYCERIN EX PADS: 1.0000 "application " | MEDICATED_PAD | CUTANEOUS | Status: DC | PRN

## 2015-02-10 MED ORDER — LANOLIN HYDROUS EX OINT: TOPICAL_OINTMENT | CUTANEOUS | Status: DC | PRN

## 2015-02-10 MED ORDER — ZOLPIDEM TARTRATE 5 MG PO TABS: 5.0000 mg | ORAL_TABLET | Freq: Every evening | ORAL | Status: DC | PRN

## 2015-02-10 MED ORDER — FENTANYL 2.5 MCG/ML BUPIVACAINE 1/10 % EPIDURAL INFUSION (WH - ANES)
INTRAMUSCULAR | Status: AC
  Filled 2015-02-10: qty 125

## 2015-02-10 MED ORDER — OXYTOCIN BOLUS FROM INFUSION
500.0000 mL | INTRAVENOUS | Status: DC
  Administered 2015-02-10: 500 mL via INTRAVENOUS

## 2015-02-10 MED ORDER — OXYCODONE HCL 5 MG PO TABS: 10.0000 mg | ORAL_TABLET | ORAL | Status: DC | PRN

## 2015-02-10 MED ORDER — DIPHENHYDRAMINE HCL 50 MG/ML IJ SOLN: 12.5000 mg | INTRAMUSCULAR | Status: DC | PRN

## 2015-02-10 MED ORDER — LIDOCAINE HCL (PF) 1 % IJ SOLN
INTRAMUSCULAR | Status: DC | PRN
  Administered 2015-02-10: 5 mL via EPIDURAL
  Administered 2015-02-10: 3 mL via EPIDURAL
  Administered 2015-02-10: 2 mL via EPIDURAL

## 2015-02-10 MED ORDER — ACETAMINOPHEN 325 MG PO TABS
650.0000 mg | ORAL_TABLET | ORAL | Status: DC | PRN
  Administered 2015-02-11 (×3): 650 mg via ORAL
  Filled 2015-02-10 (×3): qty 2

## 2015-02-10 MED ORDER — CITRIC ACID-SODIUM CITRATE 334-500 MG/5ML PO SOLN: 30.0000 mL | ORAL | Status: DC | PRN

## 2015-02-10 NOTE — Progress Notes (Signed)
LEAD discussed with Mom via interpreter line. Mom states she understands risk of giving formula but still wants to breastfeed and bottle feed. She states she breastfeed her 41 month old for 13 months and started off doing both with no problems.

## 2015-02-10 NOTE — Anesthesia Procedure Notes (Signed)
Epidural Patient location during procedure: OB  Staffing Anesthesiologist: Fitzpatrick Alberico EDWARD Performed by: anesthesiologist   Preanesthetic Checklist Completed: patient identified, pre-op evaluation, timeout performed, IV checked, risks and benefits discussed and monitors and equipment checked  Epidural Patient position: sitting Prep: DuraPrep Patient monitoring: blood pressure and continuous pulse ox Approach: midline Location: L3-L4 Injection technique: LOR air  Needle:  Needle type: Tuohy  Needle gauge: 17 G Needle length: 9 cm Needle insertion depth: 5 cm Catheter size: 19 Gauge Catheter at skin depth: 10 cm Test dose: negative and Other (1% Lidocaine)  Additional Notes Patient identified.  Risk benefits discussed including failed block, incomplete pain control, headache, nerve damage, paralysis, blood pressure changes, nausea, vomiting, reactions to medication both toxic or allergic, and postpartum back pain.  Patient expressed understanding and wished to proceed.  All questions were answered.  Sterile technique used throughout procedure and epidural site dressed with sterile barrier dressing. No paresthesia or other complications noted. The patient did not experience any signs of intravascular injection such as tinnitus or metallic taste in mouth nor signs of intrathecal spread such as rapid motor block. Please see nursing notes for vital signs. Reason for block:procedure for pain   

## 2015-02-10 NOTE — MAU Note (Addendum)
Pt sent here from OB to be admitted.  Started having contractions last night, denies LOF or bleeding.

## 2015-02-10 NOTE — H&P (Signed)
Dawn Torres is a 25 y.o. female, G2P1001 at 6 6/7 weeks, presenting from the office for active labor, cervix 4-5, 90%, vtx, 0 station, BBOW by Dr. Cloretta Ned exam, with UCs q 5-8 min.  Patient Active Problem List   Diagnosis Date Noted  . Normal labor 02/10/2015  . Language barrier 02/10/2015  Echogenic foci in abdomen noted on [place].  History of present pregnancy: Patient entered care at 70 4/7 weeks.   EDC of 02/11/15 was established by LMP.   Anatomy scan: EFW 283g, linear, appear normal EXCEPT ABDOMEN ECHOGENIC FOCI measuring 3.5 mm.   Additional [place] evaluations:  26 6/7 weeks:  EFW: 928g (43%), good interval growth, LUQ abdomen echogenic focus still present (measurements: 0.42mm, 0.45cm, now 0.592cm). . 37 6/7 weeks:  U/S: Singleton pregnancy. Vertex presentation. Posterior placenta. Fluid is normal-AFI=20th%. EFW= 2967g/6lbs 9oz (46th% lagging).   Significant prenatal events:   MFM consult: MFM 9/30: ISOLATED ECHOGENIC FOCI, FOLLOW UP IN 6 WEEKS, IF WITH ADDITIONAL FOCI OR INCREASE IN SIZE THEN DO VIRAL STUDIES. Last evaluation:  Today--cervix 4-5 cm, 90%, vtx, 0 station, BBOW by Dr. Cloretta Ned exam.  OB History    Gravida Para Term Preterm AB TAB SAB Ectopic Multiple Living   0 0 0 0 0 0 1    2015--SVB at term, in [place]  Past Medical History  Diagnosis Date  . Medical history non-contributory    Past Surgical History  Procedure Laterality Date  . No past surgeries     Family History: Non-contributory  Social History:  reports that she has never smoked. She has never used smokeless tobacco. She reports that she does not drink alcohol or use illicit drugs. Patient is from [place], here in [place] x 1 year.  Married, with husband, Jolanta Cabeza, present and supportive.     Prenatal Transfer Tool  Maternal Diabetes: No Genetic Screening: Normal Quad screen Maternal Ultrasounds/Referrals: Abnormal:  Findings:   Other: EIF in fetal abdomen Fetal Ultrasounds or other Referrals:   Referred to Materal Fetal Medicine  Maternal Substance Abuse:  No Significant Maternal Medications:  None Significant Maternal Lab Results: Lab values include: Group B Strep negative  TDAP 11/22/14 Flu 10/27/15  ROS:  Contractions, +FM  No Known Allergies   Dilation: 8.5 Effacement (%): 100 Exam by:: Vicky CNM Blood pressure 92/49, pulse 79, temperature 98 F (36.7 C), temperature source Oral, resp. rate 24, last menstrual period 05/07/2014.  Chest clear Heart RRR without murmur Abd gravid, NT, FH 39 cm Pelvic: 4-5 cm prior to arrival, as above on my first evaluation of patient. Ext: WNL  FHR: Category 1 now--had segments of variables and lates while on back s/p epidural.  Now resolved. UCs:  q 3-5 min, moderate  Prenatal labs: ABO, Rh: --/--/B POS, B POS (02/09 1310) Antibody: NEG (02/09 1310) Rubella:  Immune RPR: Nonreactive (07/26 0000)  HBsAg: Negative (07/26 0000)  HIV: Non-reactive (07/26 0000)  GBS: Negative (01/16 0000) Sickle cell/Hgb electrophoresis:  AA Pap:  WNL 08/2014 GC:  Negative 07/27/14 Chlamydia:  Negative 07/27/14 Genetic screenings:  Declined Glucola:  WNL Other:   Hgb at 12.0  NOB, 11.1 at 28 weeks       Assessment/Plan: IUP at 39 6/7 weeks Active labor GBS negative Moderate language barrier  Plan: Admit to Birthing Suite per consult with Dr. Normand Sloop Routine [facility] orders Pain med/epidural prn Language line as needed.  Samaiyah Howes, VICKICNM, [place] 02/10/2015, 3:41 PM

## 2015-02-10 NOTE — Anesthesia Preprocedure Evaluation (Signed)
Anesthesia Evaluation  Patient identified by MRN, date of birth, ID band Patient awake    Reviewed: Allergy & Precautions, NPO status , Patient's Chart, lab work & pertinent test results  History of Anesthesia Complications Negative for: history of anesthetic complications  Airway Mallampati: II  TM Distance: >3 FB Neck ROM: Full    Dental  (+) Teeth Intact, Dental Advisory Given   Pulmonary neg pulmonary ROS,    Pulmonary exam normal breath sounds clear to auscultation       Cardiovascular negative cardio ROS Normal cardiovascular exam Rhythm:Regular Rate:Normal     Neuro/Psych negative neurological ROS  negative psych ROS   GI/Hepatic negative GI ROS, Neg liver ROS,   Endo/Other  negative endocrine ROS  Renal/GU negative Renal ROS     Musculoskeletal negative musculoskeletal ROS (+)   Abdominal   Peds  Hematology  (+) Blood dyscrasia, anemia , Plt 156k    Anesthesia Other Findings Day of surgery medications reviewed with the patient.  Reproductive/Obstetrics (+) Pregnancy                             Anesthesia Physical Anesthesia Plan  ASA: II  Anesthesia Plan: Epidural   Post-op Pain Management:    Induction:   Airway Management Planned:   Additional Equipment:   Intra-op Plan:   Post-operative Plan:   Informed Consent: I have reviewed the patients History and Physical, chart, labs and discussed the procedure including the risks, benefits and alternatives for the proposed anesthesia with the patient or authorized representative who has indicated his/her understanding and acceptance.   Dental advisory given  Plan Discussed with:   Anesthesia Plan Comments: (Patient identified. Risks/Benefits/Options discussed with patient including but not limited to bleeding, infection, nerve damage, paralysis, failed block, incomplete pain control, headache, blood pressure changes,  nausea, vomiting, reactions to medication both or allergic, itching and postpartum back pain. Confirmed with bedside nurse the patient's most recent platelet count. Confirmed with patient that they are not currently taking any anticoagulation, have any bleeding history or any family history of bleeding disorders. Patient expressed understanding and wished to proceed. All questions were answered.   Online arabic interpreter utilized throughout entire patient encounter.)        Anesthesia Quick Evaluation

## 2015-02-10 NOTE — Progress Notes (Signed)
  Subjective: Comfortable now with epidural.  Husband at bedside.  Objective: BP 92/49 mmHg  Pulse 79  Temp(Src) 98 F (36.7 C) (Oral)  Resp 24  LMP 05/07/2014      FHT: Category 1 UC:   regular, every 4-5 minutes SVE:   Dilation: 8.5 Effacement (%): 100 Exam by:: Vicky CNM  BBOW--AROM, clear fluid. FSE applied without difficulty   Assessment:  Transitional labor GBS negative  Plan: Continue current care Await urge to push.  Nigel Bridgeman CNM 02/10/2015, 3:55 PM

## 2015-02-10 NOTE — Progress Notes (Signed)
Interpreter line called for education, infant safety and pt fall safety done. Interpreter #[id number] used as interpreter at this time. Pt states she and husband understands some english and can tell us if they need us to call interpreter line for translation.            

## 2015-02-11 LAB — CBC
HEMATOCRIT: 32.4 % — AB (ref 36.0–46.0)
Hemoglobin: 10.2 g/dL — ABNORMAL LOW (ref 12.0–15.0)
MCH: 24.9 pg — ABNORMAL LOW (ref 26.0–34.0)
MCHC: 31.5 g/dL (ref 30.0–36.0)
MCV: 79 fL (ref 78.0–100.0)
PLATELETS: 162 10*3/uL (ref 150–400)
RBC: 4.1 MIL/uL (ref 3.87–5.11)
RDW: 16.7 % — AB (ref 11.5–15.5)
WBC: 9.1 10*3/uL (ref 4.0–10.5)

## 2015-02-11 LAB — RPR: RPR Ser Ql: NONREACTIVE

## 2015-02-11 LAB — HIV ANTIBODY (ROUTINE TESTING W REFLEX): HIV SCREEN 4TH GENERATION: NONREACTIVE

## 2015-02-11 NOTE — Anesthesia Postprocedure Evaluation (Signed)
Anesthesia Post Note  Patient: Dawn Torres  Procedure(s) Performed: * No procedures listed *  Patient location during evaluation: Mother Baby Anesthesia Type: Epidural Level of consciousness: awake and alert and oriented Pain management: pain level controlled Vital Signs Assessment: post-procedure vital signs reviewed and stable Respiratory status: spontaneous breathing Cardiovascular status: blood pressure returned to baseline Postop Assessment: no headache, no backache, patient able to bend at knees, no signs of nausea or vomiting and adequate PO intake Anesthetic complications: no    Last Vitals:  Filed Vitals:   02/11/15 0000 02/11/15 0505  BP: 106/57 92/49  Pulse: 67 64  Temp: 37.1 C 36.8 C  Resp: 18 20    Last Pain:  Filed Vitals:   02/11/15 0535  PainSc: 0-No pain                 Braedan Meuth

## 2015-02-11 NOTE — Progress Notes (Signed)
Subjective: Postpartum Day 1: Vaginal delivery, Median episiotomy  Patient up ad lib, reports no syncope or dizziness. Feeding:  Breast Contraceptive plan:  Not discussed  Objective: Vital signs in last 24 hours: Temp:  [98 F (36.7 C)-98.9 F (37.2 C)] 98.3 F (36.8 C) (02/10 0901) Pulse Rate:  [64-121] 98 (02/10 0901) Resp:  [16-24] 18 (02/10 0901) BP: (87-127)/(35-81) 105/72 mmHg (02/10 0901) SpO2:  [100 %] 100 % (02/10 0901)  Physical Exam:  General: alert and cooperative Lochia: appropriate Uterine Fundus: firm Perineum: healing well DVT Evaluation: No evidence of DVT seen on physical exam.   CBC Latest Ref Rng 02/11/2015 02/10/2015  WBC 4.0 - 10.5 K/uL 9.1 5.3  Hemoglobin 12.0 - 15.0 g/dL 10.2(L) 11.1(L)  Hematocrit 36.0 - 46.0 % 32.4(L) 34.9(L)  Platelets 150 - 400 K/uL 162 156     Assessment/Plan: Status post vaginal delivery day 1. Stable Normal Involution Continue current care. Breastfeeding    Beatrix Fetters 02/11/2015, 11:18 AM

## 2015-02-11 NOTE — Lactation Note (Signed)
This note was copied from a baby's chart. Lactation Consultation Note  Patient Name: Dawn Torres [id number] Date: 02/11/2015 Reason for consult: Initial assessment   Initial consult on Exp BF mom of 22 hour old infant. Mom BF 64 month old for 13 months. Spoke to mom via [employer] # [phone]. Infant with 2 BF, 2 attempts, bottles of formula x 3 of 15 cc, 3 voids and 2 stools in last 24 hours. Mom was breastfeeding infant in the side lying position when I went into the room. Infant was latched and nursing. Mom reports she is not having breast tenderness, she is experiencing uterine cramping with BF. Mom reports she is a [facility] client and will call them at d/c for appointment. She does not have a breast pump at home. Advised mom to BF infant 8-12 x in 24 hours at first feeding cues. Mom wants to give formula and BF. Enc her to BF first and offer formula after BF to initiate a good supply. She reports she formula/breast fed her first child in the beginning. [name] given, informed of OP Services, Phone # and BF Support Groups. Advised mom to call with questions/concers/assistance. Mom without questions/concerns at this time.    Maternal Data Does the patient have breastfeeding experience prior to this delivery?: Yes  Feeding    LATCH Score/Interventions                      Lactation Tools Discussed/Used WIC Program: No   Consult Status Consult Status: Follow-up Date: 02/12/15 Follow-up type: In-patient    Silas Flood Chidi Shirer 02/11/2015, 3:16 PM

## 2015-02-12 DIAGNOSIS — D649 Anemia, unspecified: Secondary | ICD-10-CM

## 2015-02-12 MED ORDER — IBUPROFEN 600 MG PO TABS: 600.0000 mg | ORAL_TABLET | Freq: Four times a day (QID) | ORAL | Status: DC | PRN

## 2015-02-12 NOTE — Lactation Note (Signed)
This note was copied from a baby's chart. Lactation Consultation Note  Denies questions for lactation consultant. Aware of support groups and outpatient services. Patient Name: Dawn Torres [id number] Date: 02/12/2015     Maternal Data    Feeding Feeding Type: Breast Fed Length of feed: 12 min  LATCH Score/Interventions                      Lactation Tools Discussed/Used     Consult Status      Dawn Torres 02/12/2015, 10:57 AM

## 2015-02-12 NOTE — Progress Notes (Signed)
Language Line used for Interpreter for discharge instructions. Interpreter # [phone]

## 2015-02-12 NOTE — Discharge Instructions (Signed)
Postpartum Care After Vaginal Delivery After you deliver your newborn (postpartum period), the usual stay in the hospital is 24-72 hours. If there were problems with your labor or delivery, or if you have other medical problems, you might be in the hospital longer.  While you are in the hospital, you will receive help and instructions on how to care for yourself and your newborn during the postpartum period.  While you are in the hospital:  Be sure to tell your nurses if you have pain or discomfort, as well as where you feel the pain and what makes the pain worse.  If you had an incision made near your vagina (episiotomy) or if you had some tearing during delivery, the nurses may put ice packs on your episiotomy or tear. The ice packs may help to reduce the pain and swelling.  If you are breastfeeding, you may feel uncomfortable contractions of your uterus for a couple of weeks. This is normal. The contractions help your uterus get back to normal size.  It is normal to have some bleeding after delivery.  For the first 1-3 days after delivery, the flow is red and the amount may be similar to a period.  It is common for the flow to start and stop.  In the first few days, you may pass some small clots. Let your nurses know if you begin to pass large clots or your flow increases.  Do not  flush blood clots down the toilet before having the nurse look at them.  During the next 3-10 days after delivery, your flow should become more watery and pink or brown-tinged in color.  Ten to fourteen days after delivery, your flow should be a small amount of yellowish-white discharge.  The amount of your flow will decrease over the first few weeks after delivery. Your flow may stop in 6-8 weeks. Most women have had their flow stop by 12 weeks after delivery.  You should change your sanitary pads frequently.  Wash your hands thoroughly with soap and water for at least 20 seconds after changing pads,  using the toilet, or before holding or feeding your newborn.  You should feel like you need to empty your bladder within the first 6-8 hours after delivery.  In case you become weak, lightheaded, or faint, call your nurse before you get out of bed for the first time and before you take a shower for the first time.  Within the first few days after delivery, your breasts may begin to feel tender and full. This is called engorgement. Breast tenderness usually goes away within 48-72 hours after engorgement occurs. You may also notice milk leaking from your breasts. If you are not breastfeeding, do not stimulate your breasts. Breast stimulation can make your breasts produce more milk.  Spending as much time as possible with your newborn is very important. During this time, you and your newborn can feel close and get to know each other. Having your newborn stay in your room (rooming in) will help to strengthen the bond with your newborn. It will give you time to get to know your newborn and become comfortable caring for your newborn.  Your hormones change after delivery. Sometimes the hormone changes can temporarily cause you to feel sad or tearful. These feelings should not last more than a few days. If these feelings last longer than that, you should talk to your caregiver.  If desired, talk to your caregiver about methods of family planning or  contraception.  Talk to your caregiver about immunizations. Your caregiver may want you to have the following immunizations before leaving the hospital:  Tetanus, diphtheria, and pertussis (Tdap) or tetanus and diphtheria (Td) immunization. It is very important that you and your family (including grandparents) or others caring for your newborn are up-to-date with the Tdap or Td immunizations. The Tdap or Td immunization can help protect your newborn from getting ill.  Rubella immunization.  Varicella (chickenpox) immunization.  Influenza immunization. You  should receive this annual immunization if you did not receive the immunization during your pregnancy.   This information is not intended to replace advice given to you by your health care provider. Make sure you discuss any questions you have with your health care provider.   Document Released: 10/15/2006 Document Revised: 09/12/2011 Document Reviewed: 08/15/2011 Elsevier Interactive Patient Education [employer]. Contraception Choices Birth control (contraception) is the use of any methods or devices to stop pregnancy from happening. Below are some methods to help avoid pregnancy. HORMONAL BIRTH CONTROL  A small tube put under the skin of the upper arm (implant). The tube can stay in place for 3 years. The implant must be taken out after 3 years.  Shots given every 3 months.  Pills taken every day.  Patches that are changed once a week.  A ring put into the vagina (vaginal ring). The ring is left in place for 3 weeks and removed for 1 week. Then, a new ring is put in the vagina.  Emergency birth control pills taken after unprotected sex (intercourse). BARRIER BIRTH CONTROL   A thin covering worn on the penis (female condom) during sex.  A soft, loose covering put into the vagina (female condom) before sex.  A rubber bowl that sits over the cervix (diaphragm). The bowl must be made for you. The bowl is put into the vagina before sex. The bowl is left in place for 6 to 8 hours after sex.  A small, soft cup that fits over the cervix (cervical cap). The cup must be made for you. The cup can be left in place for 48 hours after sex.  A sponge that is put into the vagina before sex.  A chemical that kills or stops sperm from getting into the cervix and uterus (spermicide). The chemical may be a cream, jelly, foam, or pill. INTRAUTERINE (IUD) BIRTH CONTROL   IUD birth control is a small, T-shaped piece of plastic. The plastic is put inside the uterus. There are 2 types of  IUD:  Copper IUD. The IUD is covered in copper wire. The copper makes a fluid that kills sperm. It can stay in place for 10 years.  Hormone IUD. The hormone stops pregnancy from happening. It can stay in place for 5 years. PERMANENT METHODS  When the woman has her fallopian tubes sealed, tied, or blocked during surgery. This stops the egg from traveling to the uterus.  The doctor places a small coil or insert into each fallopian tube. This causes scar tissue to form and blocks the fallopian tubes.  When the female has the tubes that carry sperm tied off (vasectomy). NATURAL FAMILY PLANNING BIRTH CONTROL   Natural family planning means not having sex or using barrier birth control on the days the woman could become pregnant.  Use a calendar to keep track of the length of each period and know the days she can get pregnant.  Avoid sex during ovulation.  Use a thermometer to measure body  temperature. Also watch for symptoms of ovulation.  Time sex to be after the woman has ovulated. Use condoms to help protect yourself against sexually transmitted infections (STIs). Do this no matter what type of birth control you use. Talk to your doctor about which type of birth control is best for you.   This information is not intended to replace advice given to you by your health care provider. Make sure you discuss any questions you have with your health care provider.   Document Released: 10/15/2008 Document Revised: 12/23/2012 Document Reviewed: 07/09/2012 Elsevier Interactive Patient Education [employer]. Breastfeeding Deciding to breastfeed is one of the best choices you can make for you and your baby. A change in hormones during pregnancy causes your breast tissue to grow and increases the number and size of your milk ducts. These hormones also allow proteins, sugars, and fats from your blood supply to make breast milk in your milk-producing glands. Hormones prevent breast milk from being  released before your baby is born as well as prompt milk flow after birth. Once breastfeeding has begun, thoughts of your baby, as well as his or her sucking or crying, can stimulate the release of milk from your milk-producing glands.  BENEFITS OF BREASTFEEDING For Your Baby  Your first milk (colostrum) helps your baby's digestive system function better.  There are antibodies in your milk that help your baby fight off infections.  Your baby has a lower incidence of asthma, allergies, and sudden infant death syndrome.  The nutrients in breast milk are better for your baby than infant formulas and are designed uniquely for your baby's needs.  Breast milk improves your baby's brain development.  Your baby is less likely to develop other conditions, such as childhood obesity, asthma, or type 2 diabetes mellitus. For You  Breastfeeding helps to create a very special bond between you and your baby.  Breastfeeding is convenient. Breast milk is always available at the correct temperature and costs nothing.  Breastfeeding helps to burn calories and helps you lose the weight gained during pregnancy.  Breastfeeding makes your uterus contract to its prepregnancy size faster and slows bleeding (lochia) after you give birth.   Breastfeeding helps to lower your risk of developing type 2 diabetes mellitus, osteoporosis, and breast or ovarian cancer later in life. SIGNS THAT YOUR BABY IS HUNGRY Early Signs of Hunger  Increased alertness or activity.  Stretching.  Movement of the head from side to side.  Movement of the head and opening of the mouth when the corner of the mouth or cheek is stroked (rooting).  Increased sucking sounds, smacking lips, cooing, sighing, or squeaking.  Hand-to-mouth movements.  Increased sucking of fingers or hands. Late Signs of Hunger  Fussing.  Intermittent crying. Extreme Signs of Hunger Signs of extreme hunger will require calming and consoling before  your baby will be able to breastfeed successfully. Do not wait for the following signs of extreme hunger to occur before you initiate breastfeeding:  Restlessness.  A loud, strong cry.  Screaming. BREASTFEEDING BASICS Breastfeeding Initiation  Find a comfortable place to sit or lie down, with your neck and back well supported.  Place a pillow or rolled up blanket under your baby to bring him or her to the level of your breast (if you are seated). Nursing pillows are specially designed to help support your arms and your baby while you breastfeed.  Make sure that your baby's abdomen is facing your abdomen.  Gently massage your breast.  With your fingertips, massage from your chest wall toward your nipple in a circular motion. This encourages milk flow. You may need to continue this action during the feeding if your milk flows slowly.  Support your breast with 4 fingers underneath and your thumb above your nipple. Make sure your fingers are well away from your nipple and your baby's mouth.  Stroke your baby's lips gently with your finger or nipple.  When your baby's mouth is open wide enough, quickly bring your baby to your breast, placing your entire nipple and as much of the colored area around your nipple (areola) as possible into your baby's mouth.  More areola should be visible above your baby's upper lip than below the lower lip.  Your baby's tongue should be between his or her lower gum and your breast.  Ensure that your baby's mouth is correctly positioned around your nipple (latched). Your baby's lips should create a seal on your breast and be turned out (everted).  It is common for your baby to suck about 2-3 minutes in order to start the flow of breast milk. Latching Teaching your baby how to latch on to your breast properly is very important. An improper latch can cause nipple pain and decreased milk supply for you and poor weight gain in your baby. Also, if your baby is not  latched onto your nipple properly, he or she may swallow some air during feeding. This can make your baby fussy. Burping your baby when you switch breasts during the feeding can help to get rid of the air. However, teaching your baby to latch on properly is still the best way to prevent fussiness from swallowing air while breastfeeding. Signs that your baby has successfully latched on to your nipple:  Silent tugging or silent sucking, without causing you pain.  Swallowing heard between every 3-4 sucks.  Muscle movement above and in front of his or her ears while sucking. Signs that your baby has not successfully latched on to nipple:  Sucking sounds or smacking sounds from your baby while breastfeeding.  Nipple pain. If you think your baby has not latched on correctly, slip your finger into the corner of your baby's mouth to break the suction and place it between your baby's gums. Attempt breastfeeding initiation again. Signs of Successful Breastfeeding Signs from your baby:  A gradual decrease in the number of sucks or complete cessation of sucking.  Falling asleep.  Relaxation of his or her body.  Retention of a small amount of milk in his or her mouth.  Letting go of your breast by himself or herself. Signs from you:  Breasts that have increased in firmness, weight, and size 1-3 hours after feeding.  Breasts that are softer immediately after breastfeeding.  Increased milk volume, as well as a change in milk consistency and color by the fifth day of breastfeeding.  Nipples that are not sore, cracked, or bleeding. Signs That Your [employer] is Getting Enough Milk  Wetting at least 3 diapers in a 24-hour period. The urine should be clear and pale yellow by age 37 days.  At least 3 stools in a 24-hour period by age 37 days. The stool should be soft and yellow.  At least 3 stools in a 24-hour period by age 25 days. The stool should be seedy and yellow.  No loss of weight greater than  10% of birth weight during the first 60 days of age.  Average weight gain of 4-7 ounces (113-198 g)  per week after age 102 days.  Consistent daily weight gain by age 47 days, without weight loss after the age of 2 weeks. After a feeding, your baby may spit up a small amount. This is common. BREASTFEEDING FREQUENCY AND DURATION Frequent feeding will help you make more milk and can prevent sore nipples and breast engorgement. Breastfeed when you feel the need to reduce the fullness of your breasts or when your baby shows signs of hunger. This is called "breastfeeding on demand." Avoid introducing a pacifier to your baby while you are working to establish breastfeeding (the first 4-6 weeks after your baby is born). After this time you may choose to use a pacifier. Research has shown that pacifier use during the first year of a baby's life decreases the risk of sudden infant death syndrome (SIDS). Allow your baby to feed on each breast as long as he or she wants. Breastfeed until your baby is finished feeding. When your baby unlatches or falls asleep while feeding from the first breast, offer the second breast. Because newborns are often sleepy in the first few weeks of life, you may need to awaken your baby to get him or her to feed. Breastfeeding times will vary from baby to baby. However, the following rules can serve as a guide to help you ensure that your baby is properly fed:  Newborns (babies 14 weeks of age or younger) may breastfeed every 1-3 hours.  Newborns should not go longer than 3 hours during the day or 5 hours during the night without breastfeeding.  You should breastfeed your baby a minimum of 8 times in a 24-hour period until you begin to introduce solid foods to your baby at around 78 months of age. BREAST MILK PUMPING Pumping and storing breast milk allows you to ensure that your baby is exclusively fed your breast milk, even at times when you are unable to breastfeed. This is especially  important if you are going back to work while you are still breastfeeding or when you are not able to be present during feedings. Your lactation consultant can give you guidelines on how long it is safe to store breast milk. A breast pump is a machine that allows you to pump milk from your breast into a sterile bottle. The pumped breast milk can then be stored in a refrigerator or freezer. Some breast pumps are operated by hand, while others use electricity. Ask your lactation consultant which type will work best for you. Breast pumps can be purchased, but some hospitals and breastfeeding support groups lease breast pumps on a monthly basis. A lactation consultant can teach you how to hand express breast milk, if you prefer not to use a pump. CARING FOR YOUR BREASTS WHILE YOU BREASTFEED Nipples can become dry, cracked, and sore while breastfeeding. The following recommendations can help keep your breasts moisturized and healthy:  Avoid using soap on your nipples.  Wear a supportive bra. Although not required, special nursing bras and tank tops are designed to allow access to your breasts for breastfeeding without taking off your entire bra or top. Avoid wearing underwire-style bras or extremely tight bras.  Air dry your nipples for 3-23minutes after each feeding.  Use only cotton bra pads to absorb leaked breast milk. Leaking of breast milk between feedings is normal.  Use lanolin on your nipples after breastfeeding. Lanolin helps to maintain your skin's normal moisture barrier. If you use pure lanolin, you do not need to wash it off before  feeding your baby again. Pure lanolin is not toxic to your baby. You may also hand express a few drops of breast milk and gently massage that milk into your nipples and allow the milk to air dry. In the first few weeks after giving birth, some women experience extremely full breasts (engorgement). Engorgement can make your breasts feel heavy, warm, and tender to the  touch. Engorgement peaks within 3-5 days after you give birth. The following recommendations can help ease engorgement:  Completely empty your breasts while breastfeeding or pumping. You may want to start by applying warm, moist heat (in the shower or with warm water-soaked hand towels) just before feeding or pumping. This increases circulation and helps the milk flow. If your baby does not completely empty your breasts while breastfeeding, pump any extra milk after he or she is finished.  Wear a snug bra (nursing or regular) or tank top for 1-2 days to signal your body to slightly decrease milk production.  Apply ice packs to your breasts, unless this is too uncomfortable for you.  Make sure that your baby is latched on and positioned properly while breastfeeding. If engorgement persists after 48 hours of following these recommendations, contact your health care provider or a Science writer. OVERALL HEALTH CARE RECOMMENDATIONS WHILE BREASTFEEDING  Eat healthy foods. Alternate between meals and snacks, eating 3 of each per day. Because what you eat affects your breast milk, some of the foods may make your baby more irritable than usual. Avoid eating these foods if you are sure that they are negatively affecting your baby.  Drink milk, fruit juice, and water to satisfy your thirst (about 10 glasses a day).  Rest often, relax, and continue to take your prenatal vitamins to prevent fatigue, stress, and anemia.  Continue breast self-awareness checks.  Avoid chewing and smoking tobacco. Chemicals from cigarettes that pass into breast milk and exposure to secondhand smoke may harm your baby.  Avoid alcohol and drug use, including marijuana. Some medicines that may be harmful to your baby can pass through breast milk. It is important to ask your health care provider before taking any medicine, including all over-the-counter and prescription medicine as well as vitamin and herbal  supplements. It is possible to become pregnant while breastfeeding. If birth control is desired, ask your health care provider about options that will be safe for your baby. SEEK MEDICAL CARE IF:  You feel like you want to stop breastfeeding or have become frustrated with breastfeeding.  You have painful breasts or nipples.  Your nipples are cracked or bleeding.  Your breasts are red, tender, or warm.  You have a swollen area on either breast.  You have a fever or chills.  You have nausea or vomiting.  You have drainage other than breast milk from your nipples.  Your breasts do not become full before feedings by the fifth day after you give birth.  You feel sad and depressed.  Your baby is too sleepy to eat well.  Your baby is having trouble sleeping.   Your baby is wetting less than 3 diapers in a 24-hour period.  Your baby has less than 3 stools in a 24-hour period.  Your baby's skin or the white part of his or her eyes becomes yellow.   Your baby is not gaining weight by 26 days of age. SEEK IMMEDIATE MEDICAL CARE IF:  Your baby is overly tired (lethargic) and does not want to wake up and feed.  Your baby develops  an unexplained fever.   This information is not intended to replace advice given to you by your health care provider. Make sure you discuss any questions you have with your health care provider.   Document Released: 12/18/2004 Document Revised: 09/08/2014 Document Reviewed: 06/11/2012 Elsevier Interactive Patient Education 2016 [employer]. Anemia, Nonspecific Anemia is a condition in which the concentration of red blood cells or hemoglobin in the blood is below normal. Hemoglobin is a substance in red blood cells that carries oxygen to the tissues of the body. Anemia results in not enough oxygen reaching these tissues.  CAUSES  Common causes of anemia include:   Excessive bleeding. Bleeding may be internal or external. This includes excessive  bleeding from periods (in women) or from the intestine.   Poor nutrition.   Chronic kidney, thyroid, and liver disease.  Bone marrow disorders that decrease red blood cell production.  Cancer and treatments for cancer.  HIV, AIDS, and their treatments.  Spleen problems that increase red blood cell destruction.  Blood disorders.  Excess destruction of red blood cells due to infection, medicines, and autoimmune disorders. SIGNS AND SYMPTOMS   Minor weakness.   Dizziness.   Headache.  Palpitations.   Shortness of breath, especially with exercise.   Paleness.  Cold sensitivity.  Indigestion.  Nausea.  Difficulty sleeping.  Difficulty concentrating. Symptoms may occur suddenly or they may develop slowly.  DIAGNOSIS  Additional blood tests are often needed. These help your health care provider determine the best treatment. Your health care provider will check your stool for blood and look for other causes of blood loss.  TREATMENT  Treatment varies depending on the cause of the anemia. Treatment can include:   Supplements of iron, vitamin P29, or folic acid.   Hormone medicines.   A blood transfusion. This may be needed if blood loss is severe.   Hospitalization. This may be needed if there is significant continual blood loss.   Dietary changes.  Spleen removal. HOME CARE INSTRUCTIONS Keep all follow-up appointments. It often takes many weeks to correct anemia, and having your health care provider check on your condition and your response to treatment is very important. SEEK IMMEDIATE MEDICAL CARE IF:   You develop extreme weakness, shortness of breath, or chest pain.   You become dizzy or have trouble concentrating.  You develop heavy vaginal bleeding.   You develop a rash.   You have bloody or black, tarry stools.   You faint.   You vomit up blood.   You vomit repeatedly.   You have abdominal pain.  You have a fever or  persistent symptoms for more than 2-3 days.   You have a fever and your symptoms suddenly get worse.   You are dehydrated.  MAKE SURE YOU:  Understand these instructions.  Will watch your condition.  Will get help right away if you are not doing well or get worse.   This information is not intended to replace advice given to you by your health care provider. Make sure you discuss any questions you have with your health care provider.   Document Released: 01/26/2004 Document Revised: 08/20/2012 Document Reviewed: 06/13/2012 Elsevier Interactive Patient Education 2016 [employer]. Postpartum Depression and Baby Blues The postpartum period begins right after the birth of a baby. During this time, there is often a great amount of joy and excitement. It is also a time of many changes in the life of the parents. Regardless of how many times a mother gives  birth, each child brings new challenges and dynamics to the family. It is not unusual to have feelings of excitement along with confusing shifts in moods, emotions, and thoughts. All mothers are at risk of developing postpartum depression or the "baby blues." These mood changes can occur right after giving birth, or they may occur many months after giving birth. The baby blues or postpartum depression can be mild or severe. Additionally, postpartum depression can go away rather quickly, or it can be a long-term condition.  CAUSES Raised hormone levels and the rapid drop in those levels are thought to be a main cause of postpartum depression and the baby blues. A number of hormones change during and after pregnancy. Estrogen and progesterone usually decrease right after the delivery of your baby. The levels of thyroid hormone and various cortisol steroids also rapidly drop. Other factors that play a role in these mood changes include major life events and genetics.  RISK FACTORS If you have any of the following risks for the baby blues or  postpartum depression, know what symptoms to watch out for during the postpartum period. Risk factors that may increase the likelihood of getting the baby blues or postpartum depression include:  Having a personal or family history of depression.   Having depression while being pregnant.   Having premenstrual mood issues or mood issues related to oral contraceptives.  Having a lot of life stress.   Having marital conflict.   Lacking a social support network.   Having a baby with special needs.   Having health problems, such as diabetes.  SIGNS AND SYMPTOMS Symptoms of baby blues include:  Brief changes in mood, such as going from extreme happiness to sadness.  Decreased concentration.   Difficulty sleeping.   Crying spells, tearfulness.   Irritability.   Anxiety.  Symptoms of postpartum depression typically begin within the first month after giving birth. These symptoms include:  Difficulty sleeping or excessive sleepiness.   Marked weight loss.   Agitation.   Feelings of worthlessness.   Lack of interest in activity or food.  Postpartum psychosis is a very serious condition and can be dangerous. Fortunately, it is rare. Displaying any of the following symptoms is cause for immediate medical attention. Symptoms of postpartum psychosis include:   Hallucinations and delusions.   Bizarre or disorganized behavior.   Confusion or disorientation.  DIAGNOSIS  A diagnosis is made by an evaluation of your symptoms. There are no medical or lab tests that lead to a diagnosis, but there are various questionnaires that a health care provider may use to identify those with the baby blues, postpartum depression, or psychosis. Often, a screening tool called the [place] Postnatal Depression Scale is used to diagnose depression in the postpartum period.  TREATMENT The baby blues usually goes away on its own in 1-2 weeks. Social support is often all that is  needed. You will be encouraged to get adequate sleep and rest. Occasionally, you may be given medicines to help you sleep.  Postpartum depression requires treatment because it can last several months or longer if it is not treated. Treatment may include individual or group therapy, medicine, or both to address any social, physiological, and psychological factors that may play a role in the depression. Regular exercise, a healthy diet, rest, and social support may also be strongly recommended.  Postpartum psychosis is more serious and needs treatment right away. Hospitalization is often needed. HOME CARE INSTRUCTIONS  Get as much rest as you can. Nap  when the baby sleeps.   Exercise regularly. Some women find yoga and walking to be beneficial.   Eat a balanced and nourishing diet.   Do little things that you enjoy. Have a cup of tea, take a bubble bath, read your favorite magazine, or listen to your favorite music.  Avoid alcohol.   Ask for help with household chores, cooking, grocery shopping, or running errands as needed. Do not try to do everything.   Talk to people close to you about how you are feeling. Get support from your partner, family members, friends, or other new moms.  Try to stay positive in how you think. Think about the things you are grateful for.   Do not spend a lot of time alone.   Only take over-the-counter or prescription medicine as directed by your health care provider.  Keep all your postpartum appointments.   Let your health care provider know if you have any concerns.  SEEK MEDICAL CARE IF: You are having a reaction to or problems with your medicine. SEEK IMMEDIATE MEDICAL CARE IF:  You have suicidal feelings.   You think you may harm the baby or someone else. MAKE SURE YOU:  Understand these instructions.  Will watch your condition.  Will get help right away if you are not doing well or get worse.   This information is not intended to  replace advice given to you by your health care provider. Make sure you discuss any questions you have with your health care provider.   Document Released: 09/22/2003 Document Revised: 12/23/2012 Document Reviewed: 09/29/2012 Elsevier Interactive Patient Education 2016 [employer]. Postpartum Care After Vaginal Delivery After you deliver your newborn (postpartum period), the usual stay in the hospital is 24-72 hours. If there were problems with your labor or delivery, or if you have other medical problems, you might be in the hospital longer.  While you are in the hospital, you will receive help and instructions on how to care for yourself and your newborn during the postpartum period.  While you are in the hospital:  Be sure to tell your nurses if you have pain or discomfort, as well as where you feel the pain and what makes the pain worse.  If you had an incision made near your vagina (episiotomy) or if you had some tearing during delivery, the nurses may put ice packs on your episiotomy or tear. The ice packs may help to reduce the pain and swelling.  If you are breastfeeding, you may feel uncomfortable contractions of your uterus for a couple of weeks. This is normal. The contractions help your uterus get back to normal size.  It is normal to have some bleeding after delivery.  For the first 1-3 days after delivery, the flow is red and the amount may be similar to a period.  It is common for the flow to start and stop.  In the first few days, you may pass some small clots. Let your nurses know if you begin to pass large clots or your flow increases.  Do not  flush blood clots down the toilet before having the nurse look at them.  During the next 3-10 days after delivery, your flow should become more watery and pink or brown-tinged in color.  Ten to fourteen days after delivery, your flow should be a small amount of yellowish-white discharge.  The amount of your flow will decrease  over the first few weeks after delivery. Your flow may stop in 6-8 weeks.  Most women have had their flow stop by 12 weeks after delivery.  You should change your sanitary pads frequently.  Wash your hands thoroughly with soap and water for at least 20 seconds after changing pads, using the toilet, or before holding or feeding your newborn.  You should feel like you need to empty your bladder within the first 6-8 hours after delivery.  In case you become weak, lightheaded, or faint, call your nurse before you get out of bed for the first time and before you take a shower for the first time.  Within the first few days after delivery, your breasts may begin to feel tender and full. This is called engorgement. Breast tenderness usually goes away within 48-72 hours after engorgement occurs. You may also notice milk leaking from your breasts. If you are not breastfeeding, do not stimulate your breasts. Breast stimulation can make your breasts produce more milk.  Spending as much time as possible with your newborn is very important. During this time, you and your newborn can feel close and get to know each other. Having your newborn stay in your room (rooming in) will help to strengthen the bond with your newborn. It will give you time to get to know your newborn and become comfortable caring for your newborn.  Your hormones change after delivery. Sometimes the hormone changes can temporarily cause you to feel sad or tearful. These feelings should not last more than a few days. If these feelings last longer than that, you should talk to your caregiver.  If desired, talk to your caregiver about methods of family planning or contraception.  Talk to your caregiver about immunizations. Your caregiver may want you to have the following immunizations before leaving the hospital:  Tetanus, diphtheria, and pertussis (Tdap) or tetanus and diphtheria (Td) immunization. It is very important that you and your  family (including grandparents) or others caring for your newborn are up-to-date with the Tdap or Td immunizations. The Tdap or Td immunization can help protect your newborn from getting ill.  Rubella immunization.  Varicella (chickenpox) immunization.  Influenza immunization. You should receive this annual immunization if you did not receive the immunization during your pregnancy.   This information is not intended to replace advice given to you by your health care provider. Make sure you discuss any questions you have with your health care provider.   Document Released: 10/15/2006 Document Revised: 09/12/2011 Document Reviewed: 08/15/2011 Elsevier Interactive Patient Education 2016 [employer]. Iron-Rich Diet Iron is a mineral that helps your body to produce hemoglobin. Hemoglobin is a protein in your red blood cells that carries oxygen to your body's tissues. Eating too little iron may cause you to feel weak and tired, and it can increase your risk for infection. Eating enough iron is necessary for your body's metabolism, muscle function, and nervous system. Iron is naturally found in many foods. It can also be added to foods or fortified in foods. There are two types of dietary iron:  Heme iron. Heme iron is absorbed by the body more easily than nonheme iron. Heme iron is found in meat, poultry, and fish.  Nonheme iron. Nonheme iron is found in dietary supplements, iron-fortified grains, beans, and vegetables. You may need to follow an iron-rich diet if:  You have been diagnosed with iron deficiency or iron-deficiency anemia.  You have a condition that prevents you from absorbing dietary iron, such as:  Infection in your intestines.  Celiac disease. This involves long-lasting (chronic) inflammation of  your intestines.  You do not eat enough iron.  You eat a diet that is high in foods that impair iron absorption.  You have lost a lot of blood.  You have heavy bleeding during  your menstrual cycle.  You are pregnant. WHAT IS MY PLAN? Your health care provider may help you to determine how much iron you need per day based on your condition. Generally, when a person consumes sufficient amounts of iron in the diet, the following iron needs are met:  Men.  5-27 years old: 11 mg per day.  62-13 years old: 8 mg per day.  Women.   21-68 years old: 15 mg per day.  59-65 years old: 18 mg per day.  Over 70 years old: 8 mg per day.  Pregnant women: 27 mg per day.  Breastfeeding women: 9 mg per day. WHAT DO I NEED TO KNOW ABOUT AN IRON-RICH DIET?  Eat fresh fruits and vegetables that are high in vitamin C along with foods that are high in iron. This will help increase the amount of iron that your body absorbs from food, especially with foods containing nonheme iron. Foods that are high in vitamin C include oranges, peppers, tomatoes, and mango.  Take iron supplements only as directed by your health care provider. Overdose of iron can be life-threatening. If you were prescribed iron supplements, take them with orange juice or a vitamin C supplement.  Cook foods in pots and pans that are made from iron.   Eat nonheme iron-containing foods alongside foods that are high in heme iron. This helps to improve your iron absorption.   Certain foods and drinks contain compounds that impair iron absorption. Avoid eating these foods in the same meal as iron-rich foods or with iron supplements. These include:  Coffee, black tea, and red wine.  Milk, dairy products, and foods that are high in calcium.  Beans, soybeans, and peas.  Whole grains.  When eating foods that contain both nonheme iron and compounds that impair iron absorption, follow these tips to absorb iron better.   Soak beans overnight before cooking.  Soak whole grains overnight and drain them before using.  Ferment flours before baking, such as using yeast in bread dough. WHAT FOODS CAN I  EAT? Grains Iron-fortified breakfast cereal. Iron-fortified whole-wheat bread. Enriched rice. Sprouted grains. Vegetables Spinach. Potatoes with skin. Green peas. Broccoli. Red and green bell peppers. Fermented vegetables. Fruits Prunes. Raisins. Oranges. Strawberries. Mango. Grapefruit. Meats and Other Protein Sources Beef liver. Oysters. Beef. Shrimp. [place]. Chicken. [place]. Sardines. Chickpeas. Nuts. Tofu. Beverages Tomato juice. Fresh orange juice. Prune juice. Hibiscus tea. Fortified instant breakfast shakes. Condiments Tahini. Fermented soy sauce. Sweets and Desserts Black-strap molasses.  Other Wheat germ. The items listed above may not be a complete list of recommended foods or beverages. Contact your dietitian for more options. WHAT FOODS ARE NOT RECOMMENDED? Grains Whole grains. Bran cereal. Bran flour. Oats. Vegetables Artichokes. Brussels sprouts. Kale. Fruits Blueberries. Raspberries. Strawberries. Figs. Meats and Other Protein Sources Soybeans. Products made from soy protein. Dairy Milk. Cream. Cheese. Yogurt. Cottage cheese. Beverages Coffee. Black tea. Red wine. Sweets and Desserts Cocoa. Chocolate. Ice cream. Other Basil. Oregano. Parsley. The items listed above may not be a complete list of foods and beverages to avoid. Contact your dietitian for more information.   This information is not intended to replace advice given to you by your health care provider. Make sure you discuss any questions you have with your health care provider.  Document Released: 08/01/2004 Document Revised: 01/08/2014 Document Reviewed: 07/15/2013 Elsevier Interactive Patient Education [employer].

## 2015-02-12 NOTE — Discharge Summary (Signed)
OB Discharge Summary     Patient Name: Dawn Torres DOB: Feb 12, 1990 MRN: [id number]  Date of admission: 02/10/2015 Delivering MD: Nigel Bridgeman   Date of discharge: 02/12/2015  Admitting diagnosis: Active Labor  Intrauterine pregnancy: [redacted]w[redacted]d     Secondary diagnosis:  NA      Discharge diagnosis: Term Pregnancy Delivered and Anemia                                                                                                Post partum procedures:None  Augmentation: AROM  Complications: Difficulty w/ pushing in latter 2nd stage resulting in median episiotomy  Hospital course:  Onset of Labor With Vaginal Delivery     25 y.o. yo [id number] at [redacted]w[redacted]d was admitted in Active Labor on 02/10/2015. Patient had an uncomplicated labor course as follows:  Membrane Rupture Time/Date: 3:22 PM ,02/10/2015   Intrapartum Procedures: Episiotomy: Median [2]                                         Lacerations:  None [1]                                          Epidural                                        Fetal scalp electrode Patient had a delivery of a Viable infant. 02/10/2015  Information for the patient's newborn:  Artis, Buechele [[id number]]  Delivery Method: Vag-Spont     Pateint had an uncomplicated postpartum course.  She is ambulating, tolerating a regular diet, passing flatus, and urinating well. Patient is discharged home in stable condition on 02/12/2015.    Physical exam  Filed Vitals:   02/11/15 0505 02/11/15 0901 02/11/15 1700 02/12/15 0601  BP: 92/49 105/72 113/78 107/59  Pulse: 64 98 74 60  Temp: 98.2 F (36.8 C) 98.3 F (36.8 C) 98.3 F (36.8 C) 98.1 F (36.7 C)  TempSrc: Oral Oral Oral Oral  Resp: SpO2:  100%     General: alert, cooperative and no distress Lochia: appropriate Uterine Fundus: firm Incision: Healing well with no significant drainage DVT Evaluation: No evidence of DVT seen on physical exam Negative Homan's sign No cords or calf  tenderness No significant calf/ankle edema Labs: Lab Results  Component Value Date   WBC 9.1 02/11/2015   HGB 10.2* 02/11/2015   HCT 32.4* 02/11/2015   MCV 79.0 02/11/2015   PLT 162 02/11/2015   No flowsheet data found.  Discharge instruction: per After Visit Summary and "Baby and Me Booklet".  After visit meds:    Medication List    TAKE these medications        ibuprofen 600 MG tablet  Commonly known as:  ADVIL,MOTRIN  Take 1 tablet (600 mg total) by mouth every 6 (six) hours as needed.     prenatal multivitamin Tabs tablet  Take 1 tablet by mouth daily at 12 noon.        Diet: routine diet  Activity: Advance as tolerated. Pelvic rest for 6 weeks.   Outpatient follow BJ:YNWG in 3 days to scheduled outpatient circumcision, postpartum visit at 5 wks and Contraception vist at 6 weeks   Postpartum contraception: IUD Mirena  Newborn Data: Live born female "Mohammed Mubark" Birth Weight: 7 lb 5.5 oz (3330 g) APGAR: 9, 9 Outpatient Circumcision  Baby Feeding: Breast and Formula Disposition:home with mother   02/12/2015 Alphonzo Severance, CNM

## 2015-09-25 ENCOUNTER — Emergency Department (HOSPITAL_COMMUNITY)
Admission: EM | Admit: 2015-09-25 | Discharge: 2015-09-25 | Disposition: A | Discharge status: Home / Self Care | Payer: Medicaid Other | Attending: Emergency Medicine | Admitting: Emergency Medicine

## 2015-09-25 ENCOUNTER — Emergency Department (HOSPITAL_COMMUNITY): Payer: Medicaid Other

## 2015-09-25 ENCOUNTER — Encounter (HOSPITAL_COMMUNITY): Payer: Self-pay | Admitting: *Deleted

## 2015-09-25 DIAGNOSIS — Y999 Unspecified external cause status: Secondary | ICD-10-CM | POA: Insufficient documentation

## 2015-09-25 DIAGNOSIS — S61306A Unspecified open wound of right little finger with damage to nail, initial encounter: Secondary | ICD-10-CM | POA: Insufficient documentation

## 2015-09-25 DIAGNOSIS — W230XXA Caught, crushed, jammed, or pinched between moving objects, initial encounter: Secondary | ICD-10-CM | POA: Insufficient documentation

## 2015-09-25 DIAGNOSIS — Y939 Activity, unspecified: Secondary | ICD-10-CM | POA: Insufficient documentation

## 2015-09-25 DIAGNOSIS — Y929 Unspecified place or not applicable: Secondary | ICD-10-CM | POA: Insufficient documentation

## 2015-09-25 DIAGNOSIS — S61209A Unspecified open wound of unspecified finger without damage to nail, initial encounter: Secondary | ICD-10-CM

## 2015-09-25 MED ORDER — BUPIVACAINE HCL (PF) 0.5 % IJ SOLN
10.0000 mL | Freq: Once | INTRAMUSCULAR | Status: AC
  Administered 2015-09-25: 10 mL
  Filled 2015-09-25: qty 10

## 2015-09-25 MED ORDER — CEPHALEXIN 500 MG PO CAPS
500.0000 mg | ORAL_CAPSULE | Freq: Three times a day (TID) | ORAL | 0 refills | Status: DC
Start: 2015-09-25 — End: 2016-12-17

## 2015-09-25 MED ORDER — HYDROCODONE-ACETAMINOPHEN 5-325 MG PO TABS: 1.0000 | ORAL_TABLET | Freq: Four times a day (QID) | ORAL | 0 refills | Status: DC | PRN

## 2015-09-25 NOTE — ED Triage Notes (Signed)
Pt reports slamming her right little finger in a door. Partial amputation of fingertip noted, dressing applied and bleeding controlled at triage.

## 2015-09-25 NOTE — ED Notes (Signed)
Pt arrived to exam room via w/c,

## 2015-09-25 NOTE — Discharge Instructions (Signed)
Elevate your finger. Change dressing twice a day. Get some nonstick petroleum gauze from the pharmacy and apply it to the fingertip. Take ibuprofen or Tylenol for pain. Norco for severe pain only. Take Keflex to prevent infection. Please follow-up with hand specialist as referred

## 2015-09-25 NOTE — ED Notes (Signed)
T Kirichenko, PA, in w/pt. 

## 2015-09-25 NOTE — ED Notes (Signed)
Wound care teaching performed - voiced understanding.

## 2015-09-25 NOTE — ED Notes (Addendum)
Dr Manus Gunningancour and Angelique Holm Kirichenko, PA, in w/pt. Family at bedside.

## 2015-09-25 NOTE — ED Provider Notes (Signed)
[facility]-EMERGENCY DEPT Provider Note   CSN: [id number] Arrival date & time: 09/25/15  1246  By signing my name below, I, Octavia Heirrianna Nassar, attest that this documentation has been prepared under the direction and in the presence of Keilana Morlock, PA-C.  Electronically Signed: Octavia HeirArianna Nassar, ED Scribe. 09/25/15. 2:11 PM.    History   Chief Complaint Chief Complaint  Patient presents with  . Finger Injury    The history is provided by the patient and a relative. No language interpreter was used.   HPI Comments: Dawn Torres is a 25 y.o. female who presents to the Emergency Department complaining of a sudden onset, gradual worsening, moderate right 5th digit injury onset PTA. Pt has a partial amputation to the tip of her finger. Per brother in law, pt caught her pinky finger in a sliding glass door. Bleeding is controlled. Pt is up to date on her tetanus shot. No other injuries. Pain worsened with palpation of the laceration. Nothing is making it better. No treatment prior to coming in other the dressing to help the bleeding.  Past Medical History:  Diagnosis Date  . Medical history non-contributory     Patient Active Problem List   Diagnosis Date Noted  . Anemia 02/12/2015  . Language barrier 02/10/2015  . Vaginal delivery 02/10/2015    Past Surgical History:  Procedure Laterality Date  . NO PAST SURGERIES      OB History    Gravida Para Term Preterm AB Living   2 2 2  0 0 2   SAB TAB Ectopic Multiple Live Births   0 0 0 0 2       Home Medications    Prior to Admission medications   Medication Sig Start Date End Date Taking? Authorizing Provider  ibuprofen (ADVIL,MOTRIN) 600 MG tablet Take 1 tablet (600 mg total) by mouth every 6 (six) hours as needed. 02/12/15   Alphonzo Severanceachel Stall, CNM  Prenatal Vit-Fe Fumarate-FA (PRENATAL MULTIVITAMIN) TABS tablet Take 1 tablet by mouth daily at 12 noon.    Historical Provider, MD    Family History History reviewed. No pertinent family  history.  Social History Social History  Substance Use Topics  . Smoking status: Never Smoker  . Smokeless tobacco: Never Used  . Alcohol use No     Allergies   Review of patient's allergies indicates no known allergies.   Review of Systems Review of Systems  Skin: Positive for wound.  Neurological: Negative for weakness and numbness.     Physical Exam Updated Vital Signs BP 117/73 (BP Location: Left Arm)   Pulse 90   Temp 98.1 F (36.7 C) (Oral)   Resp 16   SpO2 100%   Physical Exam  Constitutional: She appears well-developed and well-nourished. No distress.  Eyes: Conjunctivae are normal.  Neck: Neck supple.  Musculoskeletal:  Avulsion injury through the distal right fifth finger, avulsion is diagonal, involving approximately three fourth of the nail, does not involve germinal matrix. No bony exposure. Full ROM of the finger at all joints.   Neurological: She is alert.  Skin: Skin is warm and dry.  Nursing note and vitals reviewed.    ED Treatments / Results  DIAGNOSTIC STUDIES: Oxygen Saturation is 100% on RA, normal by my interpretation.  COORDINATION OF CARE:  2:11 PM Discussed treatment plan with pt at bedside and pt agreed to plan.  Labs (all labs ordered are listed, but only abnormal results are displayed) Labs Reviewed - No data to display  EKG  EKG Interpretation None       Radiology Dg Finger Little Right  Result Date: 09/25/2015 CLINICAL DATA:  Distal soft tissue amputation in the right fifth finger due to door injury. EXAM: RIGHT LITTLE FINGER 2+V COMPARISON:  None. FINDINGS: There is a soft tissue defect at the tip of the right fifth finger. No fracture, dislocation, suspicious focal osseous lesion, appreciable arthropathy or radiopaque foreign body. IMPRESSION: Soft tissue defect at the tip of the right fifth finger. No radiopaque foreign body. No fracture or malalignment. Electronically Signed   By: Delbert Phenix M.D.   On: 09/25/2015  13:56    Procedures Procedures (including critical care time)  NERVE BLOCK Performed by: Jaynie Crumble A Consent: Verbal consent obtained. Required items: required blood products, implants, devices, and special equipment available Time out: Immediately prior to procedure a "time out" was called to verify the correct patient, procedure, equipment, support staff and site/side marked as required.  Indication: fingertip avulsion Nerve block body site: right fifth finger  Preparation: Patient was prepped and draped in the usual sterile fashion. Needle gauge: 27G Location technique: anatomical landmarks  Local anesthetic: bupivacaine 0.5% wo epi  Anesthetic total: 4 ml  Outcome: pain improved Patient tolerance: Patient tolerated the procedure well with no immediate complications.    Medications Ordered in ED Medications  bupivacaine (MARCAINE) 0.5 % injection 10 mL (not administered)     Initial Impression / Assessment and Plan / ED Course  I have reviewed the triage vital signs and the nursing notes.  Pertinent labs & imaging results that were available during my care of the patient were reviewed by me and considered in my medical decision making (see chart for details).  Clinical Course   Patient emergency department with fingertip avulsion. Digital block performed, wound cleaned thoroughly. No bony exposure. Xray negative. Attempted to call Dr. Izora Ribas given nailbed injury, who was in OR. Discussed with Dr. Manus Gunning, will put nonstick dressing, Kerlix, discharge home with Keflex, pain medications, follow-up with Dr. Izora Ribas in office.  Vitals:   09/25/15 1304 09/25/15 1531  BP: 117/73 114/67  Pulse: 90 68  Resp: 16 17  Temp: 98.1 F (36.7 C)   TempSrc: Oral   SpO2: 100% 100%      Final Clinical Impressions(s) / ED Diagnoses   Final diagnoses:  Fingertip avulsion, initial encounter    New Prescriptions Discharge Medication List as of 09/25/2015  3:24 PM      START taking these medications   Details  cephALEXin (KEFLEX) 500 MG capsule Take 1 capsule (500 mg total) by mouth 3 (three) times daily., Starting Sun 09/25/2015, Print    HYDROcodone-acetaminophen [facility]) 5-325 MG tablet Take 1 tablet by mouth every 6 (six) hours as needed., Starting Sun 09/25/2015, Print         Jaynie Crumble, PA-C 09/25/15 1612    Glynn Octave, MD 09/25/15 1610

## 2015-09-25 NOTE — ED Notes (Signed)
Minimal English speaking - brother-in-law assisting w/interpreting. Pt voices OK w/this. Spouse also w/pt. States shut finger in door accidentally.

## 2016-12-13 ENCOUNTER — Ambulatory Visit: Payer: BLUE CROSS/BLUE SHIELD | Admitting: Family Medicine

## 2016-12-13 ENCOUNTER — Other Ambulatory Visit: Payer: Self-pay

## 2016-12-13 ENCOUNTER — Encounter: Payer: Self-pay | Admitting: Family Medicine

## 2016-12-13 VITALS — Temp 98.6°F | Wt 125.4 lb

## 2016-12-13 DIAGNOSIS — J029 Acute pharyngitis, unspecified: Secondary | ICD-10-CM

## 2016-12-13 LAB — POCT RAPID STREP A (OFFICE): Rapid Strep A Screen: NEGATIVE

## 2016-12-13 MED ORDER — AMOXICILLIN 500 MG PO CAPS
500.0000 mg | ORAL_CAPSULE | Freq: Two times a day (BID) | ORAL | 0 refills | Status: DC
Start: 2016-12-13 — End: 2017-08-13

## 2016-12-13 NOTE — Progress Notes (Signed)
   12/13/20183:35 PM  Dawn Torres 11/13/1990, 26 y.o. female [id number]  Chief Complaint  Patient presents with  . Sore Throat    flu like symptoms    HPI:   Patient is a 26 y.o. female who presents today for about a week of sore throat, headache and malaise. Denies fevers but has had chills. Minimal cough. No ear pain, nausea, vomiting or diarrhea. No rash. + sick contacts at home.  Depression screen [facility] 2/9 10/01/2014 07/14/2014  Decreased Interest 0 0  Down, Depressed, Hopeless 0 0  PHQ - 2 Score 0 0    No Known Allergies  Prior to Admission medications   Medication Sig Start Date End Date Taking? Authorizing Provider  Prenatal Vit-Fe Fumarate-FA (PRENATAL MULTIVITAMIN) TABS tablet Take 1 tablet by mouth daily at 12 noon.   Yes [provider]    Past Medical History:  Diagnosis Date  . Medical history non-contributory     Past Surgical History:  Procedure Laterality Date  . NO PAST SURGERIES      Social History   Tobacco Use  . Smoking status: Never Smoker  . Smokeless tobacco: Never Used  Substance Use Topics  . Alcohol use: No    Alcohol/week: 0.0 oz    History reviewed. No pertinent family history.  ROS Per HPI  OBJECTIVE:  Temperature 98.6 F (37 C), temperature source Oral, weight 125 lb 6.4 oz (56.9 kg), last menstrual period 11/13/2016, unknown if currently breastfeeding.  Physical Exam  Gen: AAOx3, NAD HEENT: [place]/AT, PERRLA, EOMI, TM's pearly and gray,  OP with erythema and swelling, left tonsil with exudates + cervical LN,  Tender Resp: CTAB, no w/r/r CV: RRR no m/r/g Skin: warm and dry, no rashes noted  Results for orders placed or performed in visit on 12/13/16 (from the past 24 hour(s))  POCT rapid strep A     Status: None   Collection Time: 12/13/16  3:22 PM  Result Value Ref Range   Rapid Strep A Screen Negative Negative    ASSESSMENT and PLAN 1. Pharyngitis, unspecified etiology Discussed supportive measures, new meds  r/se/b and RTC precautions. Patient educational handout given. - POCT rapid strep A - Culture, Group A Strep  Other orders - amoxicillin (AMOXIL) 500 MG capsule; Take 1 capsule (500 mg total) by mouth 2 (two) times daily.  Return if symptoms worsen or fail to improve.    Myles LippsIrma M Santiago, MD [facility] at [facility] [address] [place], [place] [address] Ph.  [phone] Fax [phone]

## 2016-12-16 LAB — CULTURE, GROUP A STREP: Strep A Culture: POSITIVE — AB

## 2016-12-17 ENCOUNTER — Encounter: Payer: Self-pay | Admitting: Family Medicine

## 2016-12-17 ENCOUNTER — Telehealth: Payer: Self-pay

## 2016-12-17 NOTE — Telephone Encounter (Signed)
Copied from CRM [phone]. Topic: Quick Communication - Lab Results >> Dec 17, 2016  3:09 PM Lelon FrohlichGolden, [place] Kuang, [place] wrote: Inetta Fermoina from [facility] called with positive group A beta strep results

## 2016-12-17 NOTE — Progress Notes (Signed)
On appropriate abx, no further action needed

## 2017-06-06 ENCOUNTER — Emergency Department (HOSPITAL_COMMUNITY)
Admission: EM | Admit: 2017-06-06 | Discharge: 2017-06-07 | Disposition: A | Discharge status: Home / Self Care | Payer: BLUE CROSS/BLUE SHIELD | Attending: Emergency Medicine | Admitting: Emergency Medicine

## 2017-06-06 ENCOUNTER — Other Ambulatory Visit: Payer: Self-pay

## 2017-06-06 DIAGNOSIS — Y998 Other external cause status: Secondary | ICD-10-CM | POA: Insufficient documentation

## 2017-06-06 DIAGNOSIS — Y9389 Activity, other specified: Secondary | ICD-10-CM | POA: Insufficient documentation

## 2017-06-06 DIAGNOSIS — Y929 Unspecified place or not applicable: Secondary | ICD-10-CM | POA: Insufficient documentation

## 2017-06-06 DIAGNOSIS — S0303XA Dislocation of jaw, bilateral, initial encounter: Secondary | ICD-10-CM | POA: Insufficient documentation

## 2017-06-06 DIAGNOSIS — X58XXXA Exposure to other specified factors, initial encounter: Secondary | ICD-10-CM | POA: Diagnosis not present

## 2017-06-06 DIAGNOSIS — Z79899 Other long term (current) drug therapy: Secondary | ICD-10-CM | POA: Insufficient documentation

## 2017-06-06 DIAGNOSIS — S0993XA Unspecified injury of face, initial encounter: Secondary | ICD-10-CM | POA: Diagnosis present

## 2017-06-06 DIAGNOSIS — S0300XA Dislocation of jaw, unspecified side, initial encounter: Secondary | ICD-10-CM

## 2017-06-07 ENCOUNTER — Encounter (HOSPITAL_COMMUNITY): Payer: Self-pay | Admitting: *Deleted

## 2017-06-07 ENCOUNTER — Other Ambulatory Visit: Payer: Self-pay

## 2017-06-07 MED ORDER — SODIUM CHLORIDE 0.9 % IV BOLUS
500.0000 mL | Freq: Once | INTRAVENOUS | Status: AC
  Administered 2017-06-07: 500 mL via INTRAVENOUS

## 2017-06-07 MED ORDER — MIDAZOLAM HCL 2 MG/2ML IJ SOLN
2.0000 mg | Freq: Once | INTRAMUSCULAR | Status: DC
  Filled 2017-06-07: qty 2

## 2017-06-07 MED ORDER — FENTANYL CITRATE (PF) 100 MCG/2ML IJ SOLN
50.0000 ug | INTRAMUSCULAR | Status: DC | PRN
  Administered 2017-06-07: 50 ug via INTRAVENOUS
  Filled 2017-06-07: qty 2

## 2017-06-07 MED ORDER — HYDROMORPHONE HCL 2 MG/ML IJ SOLN
1.0000 mg | Freq: Once | INTRAMUSCULAR | Status: AC
  Administered 2017-06-07: 1 mg via INTRAVENOUS
  Filled 2017-06-07: qty 1

## 2017-06-07 NOTE — ED Triage Notes (Signed)
Pt c/o lockjaw for the past 30-40 mins. Hx of same. Usually able to pop back in place but unable to at present

## 2017-06-07 NOTE — ED Provider Notes (Signed)
[facility] Provider Note  CSN: [id number] Arrival date & time: 06/06/17 2348  Chief Complaint(s) Jaw Pain  HPI Dawn Torres is a 27 y.o. female with prior history of Jost dislocation presents to the emergency department with lockjaw/jaw dislocation which started 1 hour prior to arrival.  Husband reports that the patient was eating when this occurred.  She is endorsing left jaw pain that is been constant since onset. Worse with movement and palpation. No trauma. No headache, neck pain, SOB. She does have difficulty swallowing due to not being able to move the jaw.  She is endorsing nausea due to pain.  Had one episode of emesis here in the emergency department.  Nonbloody nonbilious.  HPI  Past Medical History Past Medical History:  Diagnosis Date  . Medical history non-contributory    Patient Active Problem List   Diagnosis Date Noted  . Anemia 02/12/2015  . Language barrier 02/10/2015  . Vaginal delivery 02/10/2015   Home Medication(s) Prior to Admission medications   Medication Sig Start Date End Date Taking? Authorizing Provider  amoxicillin (AMOXIL) 500 MG capsule Take 1 capsule (500 mg total) by mouth 2 (two) times daily. 12/13/16   Myles LippsSantiago, Irma M, MD  Prenatal Vit-Fe Fumarate-FA (PRENATAL MULTIVITAMIN) TABS tablet Take 1 tablet by mouth daily at 12 noon.    [provider]                                                                                                                                    Past Surgical History Past Surgical History:  Procedure Laterality Date  . NO PAST SURGERIES     Family History No family history on file.  Social History Social History   Tobacco Use  . Smoking status: Never Smoker  . Smokeless tobacco: Never Used  Substance Use Topics  . Alcohol use: No    Alcohol/week: 0.0 oz  . Drug use: No   Allergies Patient has no known allergies.  Review of Systems Review of Systems All  other systems are reviewed and are negative for acute change except as noted in the HPI  Physical Exam Vital Signs  I have reviewed the triage vital signs BP 125/60 (BP Location: Right Arm)   Pulse 79   Resp 20   SpO2 100%   Physical Exam  Constitutional: She is oriented to person, place, and time. She appears well-developed and well-nourished. No distress.  HENT:  Head: Normocephalic and atraumatic.  Right Ear: External ear normal.  Left Ear: External ear normal.  Nose: Nose normal.  Mouth held open unable to closed..   Eyes: Conjunctivae and EOM are normal. No scleral icterus.  Neck: Normal range of motion and phonation normal.  Cardiovascular: Normal rate and regular rhythm.  Pulmonary/Chest: Effort normal. No stridor. No respiratory distress.  Abdominal: She exhibits no distension.  Musculoskeletal: Normal range of motion. She exhibits no edema.  Neurological: She is alert and oriented to person, place, and time.  Skin: She is not diaphoretic.  Psychiatric: She has a normal mood and affect. Her behavior is normal.  Vitals reviewed.   ED Results and Treatments Labs (all labs ordered are listed, but only abnormal results are displayed) Labs Reviewed - No data to display                                                                                                                       EKG  EKG Interpretation  Date/Time:    Ventricular Rate:    PR Interval:    QRS Duration:   QT Interval:    QTC Calculation:   R Axis:     Text Interpretation:        Radiology No results found. Pertinent labs & imaging results that were available during my care of the patient were reviewed by me and considered in my medical decision making (see chart for details).  Medications Ordered in ED Medications  fentaNYL (SUBLIMAZE) injection 50 mcg (50 mcg Intravenous Given 06/07/17 0027)  midazolam (VERSED) injection 2 mg (has no administration in time range)  sodium chloride 0.9 %  bolus 500 mL (500 mLs Intravenous New Bag/Given 06/07/17 0107)  HYDROmorphone (DILAUDID) injection 1 mg (1 mg Intravenous Given 06/07/17 0107)                                                                                                                                    Procedures .Ortho Injury Treatment Date/Time: 06/07/2017 1:19 AM Performed by: Nira Conn, MD Authorized by: Nira Conn, MD   Consent:    Consent obtained:  Verbal   Consent given by:  Patient and spouse   Risks discussed:  Fracture, nerve damage and recurrent dislocation   Alternatives discussed:  No treatmentInjury location: jaw Location details: bilateral TMJ Injury type: dislocation Pre-procedure neurovascular assessment: neurovascularly intact  Anesthesia: Local anesthesia used: no  Patient sedated: NoManipulation performed: yes Reduction method: downward posterior pressure Reduction successful: yes Immobilization: bandage.     (including critical care time)  Medical Decision Making / ED Course I have reviewed the nursing notes for this encounter and the patient's prior records (if available in EHR or on provided paperwork).    Consistent with TMJ dislocation.  Patient given IV Dilaudid and jaw a reduced as above.  The patient appears reasonably screened and/or  stabilized for discharge and I doubt any other medical condition or other [facility] requiring further screening, evaluation, or treatment in the ED at this time prior to discharge.  The patient is safe for discharge with strict return precautions.   Final Clinical Impression(s) / ED Diagnoses Final diagnoses:  None    Disposition: Discharge  Condition: Good  I have discussed the results, Dx and Tx plan with the patient and husband who expressed understanding and agree(s) with the plan. Discharge instructions discussed at great length. The patient and husband were given strict return precautions who verbalized understanding  of the instructions. No further questions at time of discharge.    ED Discharge Orders    None       Follow Up: Primary care provider  Schedule an appointment as soon as possible for a visit  As needed  Serena Colonel, MD [address] [place] [address] [phone]  Schedule an appointment as soon as possible for a visit  As needed    This chart was dictated using voice recognition software.  Despite best efforts to proofread,  errors can occur which can change the documentation meaning.   Nira Conn, MD 06/07/17 [phone]

## 2017-06-07 NOTE — ED Notes (Signed)
Pt was DC out of pyxis before DominicaBrittany Oldland RN could waste 50mcg of Fentanyl. Wasted with this Charity fundraiserN.

## 2017-08-13 ENCOUNTER — Encounter: Payer: Self-pay | Admitting: Physician Assistant

## 2017-08-13 ENCOUNTER — Ambulatory Visit: Payer: BLUE CROSS/BLUE SHIELD | Admitting: Physician Assistant

## 2017-08-13 ENCOUNTER — Other Ambulatory Visit: Payer: Self-pay

## 2017-08-13 VITALS — BP 108/60 | HR 83 | Temp 98.9°F | Resp 18 | Ht 60.0 in | Wt 134.4 lb

## 2017-08-13 DIAGNOSIS — Z3200 Encounter for pregnancy test, result unknown: Secondary | ICD-10-CM

## 2017-08-13 DIAGNOSIS — N912 Amenorrhea, unspecified: Secondary | ICD-10-CM | POA: Diagnosis not present

## 2017-08-13 DIAGNOSIS — Z3A01 Less than 8 weeks gestation of pregnancy: Secondary | ICD-10-CM

## 2017-08-13 LAB — POCT URINE PREGNANCY: Preg Test, Ur: POSITIVE — AB

## 2017-08-13 NOTE — Patient Instructions (Addendum)
Due date - 04/11/2018 Est 5 weeks and 4 days   I will contact you with your lab results within the next 2 weeks.  If you have not heard from us then please contact us. The fastest way to get your results is to register for My Chart.   IF you received an x-ray today, you will receive an invoice from [facility]. Please contact [facility] at [phone] with questions or concerns regarding your invoice.   IF you received labwork today, you will receive an invoice from [facility]. Please contact [facility] at [phone] with questions or concerns regarding your invoice.   Our billing staff will not be able to assist you with questions regarding bills from these companies.  You will be contacted with the lab results as soon as they are available. The fastest way to get your results is to activate your My Chart account. Instructions are located on the last page of this paperwork. If you have not heard from us regarding the results in 2 weeks, please contact this office.

## 2017-08-13 NOTE — Progress Notes (Signed)
Dawn Torres  MRN: [id number] DOB: 01/24/1990  PCP: Patient, No Pcp Per  Chief Complaint  Patient presents with  . Possible Pregnancy    Subjective:  Pt presents to clinic for pregnancy confirmation. Home test has been positive.  LMP 7/5 - 2 days ago she had some bleeding which has now stopped.  With that she has some mild back pain which has resolved.  No urinary symptoms.  No vaginal discharge.  History is obtained by patient.  Review of Systems  Constitutional: Negative for chills.  Genitourinary: Positive for vaginal bleeding.    Patient Active Problem List   Diagnosis Date Noted  . Anemia 02/12/2015  . Language barrier 02/10/2015    Current Outpatient Medications on File Prior to Visit  Medication Sig Dispense Refill  . Prenatal Vit-Fe Fumarate-FA (PRENATAL MULTIVITAMIN) TABS tablet Take 1 tablet by mouth daily at 12 noon.     No current facility-administered medications on file prior to visit.     No Known Allergies  Past Medical History:  Diagnosis Date  . Medical history non-contributory    Social History   Social History Narrative   From [place]. [place] the [place] 01[place]2014 with her infant daughter, to join her husband, already living here.   Social History   Tobacco Use  . Smoking status: Never Smoker  . Smokeless tobacco: Never Used  Substance Use Topics  . Alcohol use: No    Alcohol/week: 0.0 standard drinks  . Drug use: No   family history is not on file.     Objective:  BP 108/60   Pulse 83   Temp 98.9 F (37.2 C) (Oral)   Resp 18   Ht 5' (1.524 m)   Wt 134 lb 6.4 oz (61 kg)   SpO2 99%   BMI 26.25 kg/m  Body mass index is 26.25 kg/m.  Wt Readings from Last 3 Encounters:  08/13/17 134 lb 6.4 oz (61 kg)  12/13/16 125 lb 6.4 oz (56.9 kg)  10/01/14 136 lb 4 oz (61.8 kg)    Physical Exam  Constitutional: She is oriented to person, place, and time. She appears well-developed and well-nourished.  HENT:  Head: Normocephalic and  atraumatic.  Right Ear: Hearing and external ear normal.  Left Ear: Hearing and external ear normal.  Eyes: Conjunctivae are normal.  Neck: Normal range of motion.  Pulmonary/Chest: Effort normal.  Genitourinary: Pelvic exam was performed with patient supine.  Genitourinary Comments: Closed cervix without any bleeding seen  Neurological: She is alert and oriented to person, place, and time.  Skin: Skin is warm, dry and intact.  Psychiatric: She has a normal mood and affect. Her behavior is normal. Judgment and thought content normal.  Vitals reviewed.   Results for orders placed or performed in visit on 08/13/17  POCT urine pregnancy  Result Value Ref Range   Preg Test, Ur Positive (A) Negative    Assessment and Plan :  Amenorrhea  Possible pregnancy - Plan: POCT urine pregnancy  Pregnancy with 5 completed weeks gestation   Closed cervical os - suspect bleeding maybe implantation bleeding - encouraged PNV that pt can get OTC.  Gave paperwork for the ability to make an appt with GYN.  Patient verbalized to me that they understand the following: diagnosis, what is being done for them, what to expect and what should be done at home.  Their questions have been answered.  See after visit summary for patient specific instructions.  Benny LennertSarah Polette Nofsinger PA-C  Primary  Care at [facility] 08/13/2017 5:21 PM  Please note: Portions of this report may have been transcribed using dragon voice recognition software. Every effort was made to ensure accuracy; however, inadvertent computerized transcription errors may be present.

## 2017-08-30 ENCOUNTER — Encounter (HOSPITAL_COMMUNITY): Payer: Self-pay | Admitting: *Deleted

## 2017-08-30 ENCOUNTER — Inpatient Hospital Stay (HOSPITAL_COMMUNITY)
Admission: AD | Admit: 2017-08-30 | Discharge: 2017-08-30 | Disposition: A | Discharge status: Home / Self Care | Payer: BLUE CROSS/BLUE SHIELD | Source: Ambulatory Visit | Attending: Obstetrics & Gynecology | Admitting: Obstetrics & Gynecology

## 2017-08-30 DIAGNOSIS — Z3A08 8 weeks gestation of pregnancy: Secondary | ICD-10-CM | POA: Diagnosis not present

## 2017-08-30 DIAGNOSIS — O219 Vomiting of pregnancy, unspecified: Secondary | ICD-10-CM | POA: Insufficient documentation

## 2017-08-30 DIAGNOSIS — R112 Nausea with vomiting, unspecified: Secondary | ICD-10-CM | POA: Diagnosis present

## 2017-08-30 LAB — URINALYSIS, ROUTINE W REFLEX MICROSCOPIC
BILIRUBIN URINE: NEGATIVE
GLUCOSE, UA: NEGATIVE mg/dL
KETONES UR: NEGATIVE mg/dL
LEUKOCYTES UA: NEGATIVE
Nitrite: NEGATIVE
PROTEIN: NEGATIVE mg/dL
Specific Gravity, Urine: 1.019 (ref 1.005–1.030)
pH: 6 (ref 5.0–8.0)

## 2017-08-30 LAB — COMPREHENSIVE METABOLIC PANEL
ALBUMIN: 3.7 g/dL (ref 3.5–5.0)
ALT: 12 U/L (ref 0–44)
ANION GAP: 8 (ref 5–15)
AST: 14 U/L — AB (ref 15–41)
Alkaline Phosphatase: 67 U/L (ref 38–126)
BUN: 10 mg/dL (ref 6–20)
CHLORIDE: 103 mmol/L (ref 98–111)
CO2: 22 mmol/L (ref 22–32)
Calcium: 9.1 mg/dL (ref 8.9–10.3)
Creatinine, Ser: 0.56 mg/dL (ref 0.44–1.00)
GFR calc Af Amer: >60 mL/min (ref >60)
GFR calc non Af Amer: >60 mL/min (ref >60)
GLUCOSE: 95 mg/dL (ref 70–99)
POTASSIUM: 3.7 mmol/L (ref 3.5–5.1)
SODIUM: 133 mmol/L — AB (ref 135–145)
TOTAL PROTEIN: 7.5 g/dL (ref 6.5–8.1)
Total Bilirubin: 0.3 mg/dL (ref 0.3–1.2)

## 2017-08-30 LAB — CBC WITH DIFFERENTIAL/PLATELET
BASOS ABS: 0 10*3/uL (ref 0.0–0.1)
BASOS PCT: 0 %
EOS ABS: 0.1 10*3/uL (ref 0.0–0.7)
EOS PCT: 2 %
HCT: 38 % (ref 36.0–46.0)
Hemoglobin: 12.6 g/dL (ref 12.0–15.0)
Lymphocytes Relative: 28 %
Lymphs Abs: 1.3 10*3/uL (ref 0.7–4.0)
MCH: 28.8 pg (ref 26.0–34.0)
MCHC: 33.2 g/dL (ref 30.0–36.0)
MCV: 87 fL (ref 78.0–100.0)
MONO ABS: 0.2 10*3/uL (ref 0.1–1.0)
MONOS PCT: 5 %
NEUTROS ABS: 3.2 10*3/uL (ref 1.7–7.7)
Neutrophils Relative %: 65 %
PLATELETS: 182 10*3/uL (ref 150–400)
RBC: 4.37 MIL/uL (ref 3.87–5.11)
RDW: 13.3 % (ref 11.5–15.5)
WBC: 4.8 10*3/uL (ref 4.0–10.5)

## 2017-08-30 MED ORDER — ONDANSETRON HCL 4 MG PO TABS: 4.0000 mg | ORAL_TABLET | Freq: Every day | ORAL | 1 refills | Status: DC | PRN

## 2017-08-30 NOTE — MAU Provider Note (Signed)
Chief Complaint: Emesis   None    SUBJECTIVE HPI: Dawn Torres is a 27 y.o. G4P2002 at [redacted]w[redacted]d who presents to Maternity Admissions reporting nausea and vomiting for weeks.  No nausea medication.  Vomited x 2 today. Feeling fatigued.   Past Medical History:  Diagnosis Date  . Medical history non-contributory    OB History  Gravida Para Term Preterm AB Living  4 2 2  0 0 2  SAB TAB Ectopic Multiple Live Births  0 0 0 0 2    # Outcome Date GA Lbr Len/2nd Weight Sex Delivery Anes PTL Lv  4 Current           3 Term 02/10/15 [redacted]w[redacted]d 08:14 / 00:25 3330 g M Vag-Spont EPI  LIV     Birth Comments: Hgb, Normal, FA Newborn Screen Barcode: 02/11/2015 Date Collected: 02/11/2015  2 Term 09/2013    F    LIV  1 Gravida            Past Surgical History:  Procedure Laterality Date  . NO PAST SURGERIES     Social History   Socioeconomic History  . Marital status: Married    Spouse name: Siyah Mault  . Number of children: 1  . Years of education: College  . Highest education level: Not on file  Occupational History  . 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 Financial resource strain: Not on file  . Food insecurity:    Worry: Not on file    Inability: Not on file  . Transportation needs:    Medical: Not on file    Non-medical: Not on file  Tobacco Use  . Smoking status: Never Smoker  . Smokeless tobacco: Never Used  Substance and Sexual Activity  . Alcohol use: No    Alcohol/week: 0.0 standard drinks  . Drug use: No  . Sexual activity: Yes    Partners: Male    Birth control/protection: None    Comment: trying to become pregnant (04/2014)  Lifestyle  . Physical activity:    Days per week: Not on file    Minutes per session: Not on file  . Stress: Not on file  Relationships  . Social connections:    Talks on phone: Not on file    Gets together: Not on file    Attends religious service: Not on file    Active member of club or organization: Not on file    Attends  meetings of clubs or organizations: Not on file    Relationship status: Not on file  . Intimate partner violence:    Fear of current or ex partner: Not on file    Emotionally abused: Not on file    Physically abused: Not on file    Forced sexual activity: Not on file  Other Topics Concern  . Not on file  Social History Narrative   From [place]. Arrived in the [place] 01/2014 with her infant daughter, to join her husband, already living here.   No family history on file. No current facility-administered medications on file prior to encounter.    Current Outpatient Medications on File Prior to Encounter  Medication Sig Dispense Refill  . Prenatal Vit-Fe Fumarate-FA (PRENATAL MULTIVITAMIN) TABS tablet Take 1 tablet by mouth daily at 12 noon.     No Known Allergies  I have reviewed patient's Past Medical Hx, Surgical Hx, Family Hx, Social Hx, medications and allergies.   Review of Systems  Constitutional: Negative.   HENT: Negative.  Eyes: Negative.   Respiratory: Negative.   Cardiovascular: Negative.   Gastrointestinal: Negative.   Endocrine: Negative.   Genitourinary: Positive for vaginal bleeding.  Musculoskeletal: Negative.   Skin: Negative.   Allergic/Immunologic: Negative.   Neurological: Negative.   Hematological: Negative.   Psychiatric/Behavioral: Negative.     OBJECTIVE Patient Vitals for the past 24 hrs:  BP Temp Temp src Pulse Resp SpO2  08/30/17 1737 (!) 110/56 - - 75 16 -  08/30/17 1542 112/63 98.4 F (36.9 C) Oral 78 16 100 %   Constitutional: Well-developed, well-nourished female in no acute distress.  Cardiovascular: normal rate Respiratory: normal rate and effort.  GI: Abd soft, non-tender, gravid appropriate for gestational age. Pos BS x 4 MS: Extremities nontender, no edema, normal ROM Neurologic: Alert and oriented x 4.  GU: Neg CVAT.   LAB RESULTS Results for orders placed or performed during the hospital encounter of 08/30/17 (from the past 24  hour(s))  Urinalysis, Routine w reflex microscopic     Status: Abnormal   Collection Time: 08/30/17  3:47 PM  Result Value Ref Range   Color, Urine YELLOW YELLOW   APPearance CLEAR CLEAR   Specific Gravity, Urine 1.019 1.005 - 1.030   pH 6.0 5.0 - 8.0   Glucose, UA NEGATIVE NEGATIVE mg/dL   Hgb urine dipstick SMALL (A) NEGATIVE   Bilirubin Urine NEGATIVE NEGATIVE   Ketones, ur NEGATIVE NEGATIVE mg/dL   Protein, ur NEGATIVE NEGATIVE mg/dL   Nitrite NEGATIVE NEGATIVE   Leukocytes, UA NEGATIVE NEGATIVE   RBC / HPF 0-5 0 - 5 RBC/hpf   WBC, UA 0-5 0 - 5 WBC/hpf   Bacteria, UA RARE (A) NONE SEEN   Squamous Epithelial / LPF 0-5 0 - 5   Mucus PRESENT   CBC with Differential/Platelet     Status: None   Collection Time: 08/30/17  4:21 PM  Result Value Ref Range   WBC 4.8 4.0 - 10.5 K/uL   RBC 4.37 3.87 - 5.11 MIL/uL   Hemoglobin 12.6 12.0 - 15.0 g/dL   HCT 16.138.0 09.636.0 - 04.546.0 %   MCV 87.0 78.0 - 100.0 fL   MCH 28.8 26.0 - 34.0 pg   MCHC 33.2 30.0 - 36.0 g/dL   RDW 40.913.3 81.111.5 - 91.415.5 %   Platelets 182 150 - 400 K/uL   Neutrophils Relative % 65 %   Neutro Abs 3.2 1.7 - 7.7 K/uL   Lymphocytes Relative 28 %   Lymphs Abs 1.3 0.7 - 4.0 K/uL   Monocytes Relative 5 %   Monocytes Absolute 0.2 0.1 - 1.0 K/uL   Eosinophils Relative 2 %   Eosinophils Absolute 0.1 0.0 - 0.7 K/uL   Basophils Relative 0 %   Basophils Absolute 0.0 0.0 - 0.1 K/uL  Comprehensive metabolic panel     Status: Abnormal   Collection Time: 08/30/17  4:21 PM  Result Value Ref Range   Sodium 133 (L) 135 - 145 mmol/L   Potassium 3.7 3.5 - 5.1 mmol/L   Chloride 103 98 - 111 mmol/L   CO2 22 22 - 32 mmol/L   Glucose, Bld 95 70 - 99 mg/dL   BUN 10 6 - 20 mg/dL   Creatinine, Ser 7.820.56 0.44 - 1.00 mg/dL   Calcium 9.1 8.9 - 95.610.3 mg/dL   Total Protein 7.5 6.5 - 8.1 g/dL   Albumin 3.7 3.5 - 5.0 g/dL   AST 14 (L) 15 - 41 U/L   ALT 12 0 - 44 U/L  Alkaline Phosphatase 67 38 - 126 U/L   Total Bilirubin 0.3 0.3 - 1.2 mg/dL   GFR  calc non Af Amer >60 >60 mL/min   GFR calc Af Amer >60 >60 mL/min   Anion gap 8 5 - 15    IMAGING No results found.  MAU COURSE Orders Placed This Encounter  Procedures  . Urinalysis, Routine w reflex microscopic  . CBC with Differential/Platelet  . Comprehensive metabolic panel  . Discharge patient Discharge disposition: 01-Home or Self Care; Discharge patient date: 08/30/2017   Meds ordered this encounter  Medications  . ondansetron (ZOFRAN) 4 MG tablet    Sig: Take 1 tablet (4 mg total) by mouth daily as needed for nausea or vomiting.    Dispense:  30 tablet    Refill:  1    MDM PE, labs reviewed and negative ASSESSMENT   Nausea and vomiting in pregnancy PLAN Discharge home in stable condition. bleeding precautions Follow-up Information    [facility] Follow up in 2 week(s).   Specialty:  Obstetrics and Gynecology Contact information: [address] [place] [address] [phone]         Allergies as of 08/30/2017   No Known Allergies     Medication List    TAKE these medications   ondansetron 4 MG tablet Commonly known as:  ZOFRAN Take 1 tablet (4 mg total) by mouth daily as needed for nausea or vomiting.   prenatal multivitamin Tabs tablet Take 1 tablet by mouth daily at 12 noon.        Kenney Houseman, CNM 08/30/2017  5:52 PM

## 2017-08-30 NOTE — Discharge Instructions (Signed)

## 2017-08-30 NOTE — MAU Note (Signed)
Pt reports nausea and vomiting. Denies pain.

## 2017-09-11 LAB — OB RESULTS CONSOLE HEPATITIS B SURFACE ANTIGEN: Hepatitis B Surface Ag: NEGATIVE

## 2017-09-11 LAB — OB RESULTS CONSOLE ABO/RH: RH Type: POSITIVE

## 2017-09-11 LAB — OB RESULTS CONSOLE GC/CHLAMYDIA
Chlamydia: NEGATIVE
Gonorrhea: NEGATIVE

## 2017-09-11 LAB — OB RESULTS CONSOLE RUBELLA ANTIBODY, IGM: RUBELLA: IMMUNE

## 2017-09-11 LAB — OB RESULTS CONSOLE HIV ANTIBODY (ROUTINE TESTING): HIV: NONREACTIVE

## 2017-09-11 LAB — OB RESULTS CONSOLE ANTIBODY SCREEN: ANTIBODY SCREEN: NEGATIVE

## 2017-09-11 LAB — OB RESULTS CONSOLE RPR: RPR: NONREACTIVE

## 2017-10-05 ENCOUNTER — Emergency Department (HOSPITAL_COMMUNITY)
Admission: EM | Admit: 2017-10-05 | Discharge: 2017-10-06 | Disposition: A | Discharge status: Home / Self Care | Payer: BLUE CROSS/BLUE SHIELD | Attending: Emergency Medicine | Admitting: Emergency Medicine

## 2017-10-05 ENCOUNTER — Encounter (HOSPITAL_COMMUNITY): Payer: Self-pay | Admitting: Emergency Medicine

## 2017-10-05 ENCOUNTER — Other Ambulatory Visit: Payer: Self-pay

## 2017-10-05 DIAGNOSIS — Y92009 Unspecified place in unspecified non-institutional (private) residence as the place of occurrence of the external cause: Secondary | ICD-10-CM | POA: Diagnosis not present

## 2017-10-05 DIAGNOSIS — Y939 Activity, unspecified: Secondary | ICD-10-CM | POA: Diagnosis not present

## 2017-10-05 DIAGNOSIS — Y999 Unspecified external cause status: Secondary | ICD-10-CM | POA: Insufficient documentation

## 2017-10-05 DIAGNOSIS — S0300XA Dislocation of jaw, unspecified side, initial encounter: Secondary | ICD-10-CM | POA: Insufficient documentation

## 2017-10-05 DIAGNOSIS — X58XXXA Exposure to other specified factors, initial encounter: Secondary | ICD-10-CM | POA: Diagnosis not present

## 2017-10-05 DIAGNOSIS — Z3A12 12 weeks gestation of pregnancy: Secondary | ICD-10-CM | POA: Diagnosis not present

## 2017-10-05 DIAGNOSIS — O9A211 Injury, poisoning and certain other consequences of external causes complicating pregnancy, first trimester: Secondary | ICD-10-CM | POA: Diagnosis not present

## 2017-10-05 MED ORDER — ACETAMINOPHEN 500 MG PO TABS
1000.0000 mg | ORAL_TABLET | Freq: Once | ORAL | Status: AC
  Administered 2017-10-05: 1000 mg via ORAL
  Filled 2017-10-05: qty 2

## 2017-10-05 NOTE — ED Triage Notes (Signed)
Pt spouse reported that pt jaw got stuck appx 1 hr ago. Spouse reported this had occurred before

## 2017-10-05 NOTE — ED Provider Notes (Signed)
[facility]-EMERGENCY DEPT Provider Note   CSN: [id number] Arrival date & time: 10/05/17  2245     History   Chief Complaint Chief Complaint  Patient presents with  . Jaw Pain    HPI Dawn Torres is a 27 y.o. female.  The history is provided by the patient and medical records.    26 y.o. F here with jaw dislocation.  Husband reports this has happened before.  They were getting ready for bed, she yawned and jaw got stuck open.  She has not been able to close her mouth.  This occurred about an hour PTA.  Patient is 3 months pregnant currently.  Past Medical History:  Diagnosis Date  . Medical history non-contributory     Patient Active Problem List   Diagnosis Date Noted  . Anemia 02/12/2015  . Language barrier 02/10/2015    Past Surgical History:  Procedure Laterality Date  . NO PAST SURGERIES       OB History    Gravida  4   Para  2   Term  2   Preterm  0   AB  0   Living  2     SAB  0   TAB  0   Ectopic  0   Multiple  0   Live Births  2            Home Medications    Prior to Admission medications   Medication Sig Start Date End Date Taking? Authorizing Provider  ondansetron (ZOFRAN) 4 MG tablet Take 1 tablet (4 mg total) by mouth daily as needed for nausea or vomiting. 08/30/17 08/30/18  Prothero, Henderson Newcomer, CNM  Prenatal Vit-Fe Fumarate-FA (PRENATAL MULTIVITAMIN) TABS tablet Take 1 tablet by mouth daily at 12 noon.    [provider]    Family History History reviewed. No pertinent family history.  Social History Social History   Tobacco Use  . Smoking status: Never Smoker  . Smokeless tobacco: Never Used  Substance Use Topics  . Alcohol use: No    Alcohol/week: 0.0 standard drinks  . Drug use: No     Allergies   Patient has no known allergies.   Review of Systems Review of Systems  HENT: Positive for dental problem.   All other systems reviewed and are negative.    Physical  Exam Updated Vital Signs BP 137/84 (BP Location: Left Arm)   Pulse 92   Resp 18   Wt 61.7 kg   LMP 07/05/2017   SpO2 99%   BMI 26.56 kg/m   Physical Exam  Constitutional: She is oriented to person, place, and time. She appears well-developed and well-nourished.  HENT:  Head: Normocephalic and atraumatic.  Mouth/Throat: Oropharynx is clear and moist.  Mouth held open, unable to close  Eyes: Pupils are equal, round, and reactive to light. Conjunctivae and EOM are normal.  Neck: Normal range of motion.  Cardiovascular: Normal rate, regular rhythm and normal heart sounds.  Pulmonary/Chest: Effort normal and breath sounds normal.  Abdominal: Soft. Bowel sounds are normal.  Musculoskeletal: Normal range of motion.  Neurological: She is alert and oriented to person, place, and time.  Skin: Skin is warm and dry.  Psychiatric: She has a normal mood and affect.  Nursing note and vitals reviewed.    ED Treatments / Results  Labs (all labs ordered are listed, but only abnormal results are displayed) Labs Reviewed - No data to display  EKG None  Radiology No  results found.  Procedures Reduction of dislocation Date/Time: 10/05/2017 11:35 PM Performed by: Garlon Hatchet, PA-C Authorized by: Garlon Hatchet, PA-C  Consent: Verbal consent obtained. Consent given by: patient Patient understanding: patient states understanding of the procedure being performed Patient identity confirmed: verbally with patient Local anesthesia used: no  Anesthesia: Local anesthesia used: no  Sedation: Patient sedated: no  Patient tolerance: Patient tolerated the procedure well with no immediate complications Comments: Reduction of TMJ    (including critical care time)  Medications Ordered in ED Medications - No data to display   Initial Impression / Assessment and Plan / ED Course  I have reviewed the triage vital signs and the nursing notes.  Pertinent labs & imaging results that  were available during my care of the patient were reviewed by me and considered in my medical decision making (see chart for details).  27 y.o. F here with jaw pain.  Exam consistent with TMJ dislocation.  Patient 3 months pregnant, no sedation meds able to be given.  Jaw reduced with downward and posterior pressure.  Patient feeling better afterwards, she was given tylenol.  Can continue tylenol at home as well.  Can return here for any new/acute changes.  Final Clinical Impressions(s) / ED Diagnoses   Final diagnoses:  Dislocation of temporomandibular joint, initial encounter    ED Discharge Orders    None       Garlon Hatchet, PA-C 10/06/17 0058    Gerhard Munch, MD 10/07/17 2308

## 2017-10-05 NOTE — Discharge Instructions (Addendum)
Try to avoid opening your mouth too wide for the next 24-48 hours.  Soft diet may help with pain. Can take tylenol for pain safely during pregnancy. Return here for any new/acute changes.

## 2018-01-01 NOTE — L&D Delivery Note (Addendum)
Delivery Note At 7:59 AM a viable female was delivered via Vaginal, Spontaneous (Presentation: LOA).  APGAR: 9, 9; weight pending.   Placenta status: partial placenta was delivered via maternal pushing effort. On internal exam, the cervix was found to be 3cm dilated and placenta could be palpated inside the uterus. Patient was not having heavy vaginal bleeding. Dr. Normand Sloop was consulted for retained placental fragment and manual removal. Dr. Normand Sloop was able to remove the placental fragment and on bedside ultrasound no placental remnants were noted in uterus. Placenta appeared to have a small succenturiate lobe and a marginal insertion. Per Dr. Normand Sloop, patient to receive methergine series and 24 hours of Unasyn for prophylaxis after uterine exploration. Placenta sent to pathology. Cord: three vessel with the following complications: n/a.   Anesthesia:  Epidural Episiotomy: None Lacerations: 1st degree Suture Repair: 3.0 vicryl Est. Blood Loss (mL):  Approximately 350 EBL, QBL pending   Mom to postpartum.  Baby to Couplet care / Skin to Skin.  Janeece Riggers 03/22/2018, 9:06 AM  I came in to see the pt and manually removed placenta that was left.  US done and no retained placenta was seen.  The language line interpreter was used

## 2018-02-06 ENCOUNTER — Inpatient Hospital Stay (HOSPITAL_COMMUNITY)
Admission: AD | Admit: 2018-02-06 | Discharge: 2018-02-06 | Disposition: A | Discharge status: Home / Self Care | Payer: Medicaid Other | Attending: Obstetrics & Gynecology | Admitting: Obstetrics & Gynecology

## 2018-02-06 ENCOUNTER — Encounter (HOSPITAL_COMMUNITY): Payer: Self-pay | Admitting: *Deleted

## 2018-02-06 ENCOUNTER — Other Ambulatory Visit: Payer: Self-pay

## 2018-02-06 DIAGNOSIS — Z3A30 30 weeks gestation of pregnancy: Secondary | ICD-10-CM

## 2018-02-06 DIAGNOSIS — R05 Cough: Secondary | ICD-10-CM | POA: Diagnosis present

## 2018-02-06 DIAGNOSIS — R0981 Nasal congestion: Secondary | ICD-10-CM | POA: Diagnosis not present

## 2018-02-06 DIAGNOSIS — J3489 Other specified disorders of nose and nasal sinuses: Secondary | ICD-10-CM | POA: Insufficient documentation

## 2018-02-06 DIAGNOSIS — O26893 Other specified pregnancy related conditions, third trimester: Secondary | ICD-10-CM

## 2018-02-06 DIAGNOSIS — R69 Illness, unspecified: Secondary | ICD-10-CM

## 2018-02-06 DIAGNOSIS — J111 Influenza due to unidentified influenza virus with other respiratory manifestations: Secondary | ICD-10-CM

## 2018-02-06 LAB — URINALYSIS, ROUTINE W REFLEX MICROSCOPIC
Bilirubin Urine: NEGATIVE
Glucose, UA: NEGATIVE mg/dL
HGB URINE DIPSTICK: NEGATIVE
Ketones, ur: NEGATIVE mg/dL
NITRITE: NEGATIVE
PH: 6.5 (ref 5.0–8.0)
Protein, ur: NEGATIVE mg/dL

## 2018-02-06 LAB — INFLUENZA PANEL BY PCR (TYPE A & B)
INFLAPCR: POSITIVE — AB
INFLBPCR: NEGATIVE

## 2018-02-06 LAB — URINALYSIS, MICROSCOPIC (REFLEX)
BACTERIA UA: NONE SEEN
RBC / HPF: NONE SEEN RBC/hpf (ref 0–5)

## 2018-02-06 MED ORDER — OSELTAMIVIR PHOSPHATE 75 MG PO CAPS: 75.0000 mg | ORAL_CAPSULE | Freq: Two times a day (BID) | ORAL | 0 refills | Status: AC

## 2018-02-06 MED ORDER — ACETAMINOPHEN 500 MG PO TABS
1000.0000 mg | ORAL_TABLET | Freq: Once | ORAL | Status: AC
  Administered 2018-02-06: 1000 mg via ORAL
  Filled 2018-02-06: qty 2

## 2018-02-06 MED ORDER — LACTATED RINGERS IV BOLUS
1000.0000 mL | Freq: Once | INTRAVENOUS | Status: AC
  Administered 2018-02-06: 1000 mL via INTRAVENOUS

## 2018-02-06 NOTE — MAU Note (Signed)
Pain in all body, runny nose, cough and headache.  Started last night. Thinks might have fever, didn't check it. Denies sore throat.

## 2018-02-06 NOTE — MAU Provider Note (Signed)
Chief Complaint:  Cough; Generalized Body Aches; and Nasal Congestion   First Provider Initiated Contact with Patient 02/06/18 1641     HPI: Dawn Torres is a 28 y.o. G3P2002 at [redacted]w[redacted]d who presents to maternity admissions reporting body aches, cough, and congestion. Symptoms began last night. No sick contact. Complaints include rhinorrhea, nasal congestion, non productive cough, myalgias, and chills. Did not get a flu vaccine this season.  Denies contractions, leakage of fluid or vaginal bleeding. Good fetal movement. Took tylenol this morning.   Location: body aches Quality: aching Severity: 6/10 in pain scale Duration: 1 day Timing: constant Modifying factors: nothing makes worse. Not improved with tylenol this morning Associated signs and symptoms: congestion, cough, chills  Past Medical History:  Diagnosis Date  . Medical history non-contributory    OB History  Gravida Para Term Preterm AB Living  3 2 2  0 0 2  SAB TAB Ectopic Multiple Live Births  0 0 0 0 2    # Outcome Date GA Lbr Len/2nd Weight Sex Delivery Anes PTL Lv  3 Current           2 Term 02/10/15 [redacted]w[redacted]d 08:14 / 00:25 3330 g M Vag-Spont EPI  LIV     Birth Comments: Hgb, Normal, FA Newborn Screen Barcode: 02/11/2015 Date Collected: 02/11/2015  1 Term 09/2013    F    LIV   Past Surgical History:  Procedure Laterality Date  . NO PAST SURGERIES     History reviewed. No pertinent family history. Social History   Tobacco Use  . Smoking status: Never Smoker  . Smokeless tobacco: Never Used  Substance Use Topics  . Alcohol use: No    Alcohol/week: 0.0 standard drinks  . Drug use: No   No Known Allergies No medications prior to admission.    I have reviewed patient's Past Medical Hx, Surgical Hx, Family Hx, Social Hx, medications and allergies.   ROS:  Review of Systems  Constitutional: Positive for chills and fever.  HENT: Positive for congestion and rhinorrhea. Negative for ear pain and sore throat.    Respiratory: Positive for cough.   Gastrointestinal: Negative.   Genitourinary: Negative.   Musculoskeletal: Positive for myalgias.    Physical Exam   Patient Vitals for the past 24 hrs:  BP Temp Temp src Pulse Resp SpO2 Weight  02/06/18 1811 (!) 94/42 - - (!) 114 - 100 % -  02/06/18 1711 - - - - - 100 % -  02/06/18 1706 - - - - - 100 % -  02/06/18 1701 - - - - - 100 % -  02/06/18 1700 - 100 F (37.8 C) - (!) 116 - - -  02/06/18 1656 - - - - - 100 % -  02/06/18 1552 122/67 (!) 100.5 F (38.1 C) Oral (!) 142 18 98 % 67.8 kg  02/06/18 1549 - - - - - 99 % -    Constitutional: Well-developed, well-nourished female in no acute distress.  Cardiovascular: normal rate & rhythm, no murmur Respiratory: normal effort, lung sounds clear throughout GI: Abd soft, non-tender, gravid appropriate for gestational age. Pos BS x 4 MS: Extremities nontender, no edema, normal ROM Neurologic: Alert and oriented x 4.   NST:  Baseline: 145 bpm, Variability: Good {> 6 bpm), Accelerations: Reactive and Decelerations: Absent   Labs: Results for orders placed or performed during the hospital encounter of 02/06/18 (from the past 24 hour(s))  Influenza panel by PCR (type A & B)  Status: Abnormal   Collection Time: 02/06/18  4:26 PM  Result Value Ref Range   Influenza A By PCR POSITIVE (A) NEGATIVE   Influenza B By PCR NEGATIVE NEGATIVE  Urinalysis, Routine w reflex microscopic     Status: Abnormal   Collection Time: 02/06/18  5:42 PM  Result Value Ref Range   Color, Urine YELLOW YELLOW   APPearance CLEAR CLEAR   Specific Gravity, Urine <1.005 (L) 1.005 - 1.030   pH 6.5 5.0 - 8.0   Glucose, UA NEGATIVE NEGATIVE mg/dL   Hgb urine dipstick NEGATIVE NEGATIVE   Bilirubin Urine NEGATIVE NEGATIVE   Ketones, ur NEGATIVE NEGATIVE mg/dL   Protein, ur NEGATIVE NEGATIVE mg/dL   Nitrite NEGATIVE NEGATIVE   Leukocytes, UA TRACE (A) NEGATIVE  Urinalysis, Microscopic (reflex)     Status: None   Collection  Time: 02/06/18  5:42 PM  Result Value Ref Range   RBC / HPF NONE SEEN 0 - 5 RBC/hpf   WBC, UA 0-5 0 - 5 WBC/hpf   Bacteria, UA NONE SEEN NONE SEEN   Squamous Epithelial / LPF 0-5 0 - 5    Imaging:  No results found.  MAU Course: Orders Placed This Encounter  Procedures  . Urinalysis, Routine w reflex microscopic  . Influenza panel by PCR (type A & B)  . Urinalysis, Microscopic (reflex)  . Droplet precaution  . Discharge patient   Meds ordered this encounter  Medications  . acetaminophen (TYLENOL) tablet 1,000 mg  . lactated ringers bolus 1,000 mL  . oseltamivir (TAMIFLU) 75 MG capsule    Sig: Take 1 capsule (75 mg total) by mouth 2 (two) times daily for 5 days.    Dispense:  10 capsule    Refill:  0    Order Specific Question:   Supervising Provider    Answer:   Corinne Bing [[id number]]    MDM: Flu swab pending. Will tx with tamiflu based on symptoms.  Vitals and symptoms improved with IV fluids & tylenol Reactive NST Discussed OTC treatment of symptoms and good fluid intake during illness.   Assessment: 1. Influenza-like illness   2. [redacted] weeks gestation of pregnancy     Plan: Discharge home in stable condition.  Labor precautions and fetal kick counts Follow-up Information    [facility] Follow up.   Specialty:  Obstetrics and Gynecology Contact information: [address] [place] [address] [phone]          Allergies as of 02/06/2018   No Known Allergies     Medication List    TAKE these medications   ondansetron 4 MG tablet Commonly known as:  ZOFRAN Take 1 tablet (4 mg total) by mouth daily as needed for nausea or vomiting.   oseltamivir 75 MG capsule Commonly known as:  TAMIFLU Take 1 capsule (75 mg total) by mouth 2 (two) times daily for 5 days.   prenatal multivitamin Tabs tablet Take 1 tablet by mouth daily at 12 noon.       Judeth Horn, NP 02/06/2018 7:09  PM

## 2018-02-06 NOTE — Discharge Instructions (Signed)
Influenza, Adult Influenza is also called "the flu." It is an infection in the lungs, nose, and throat (respiratory tract). It is caused by a virus. The flu causes symptoms that are similar to symptoms of a cold. It also causes a high fever and body aches. The flu spreads easily from person to person (is contagious). Getting a flu shot (influenza vaccination) every year is the best way to prevent the flu. What are the causes? This condition is caused by the influenza virus. You can get the virus by:  Breathing in droplets that are in the air from the cough or sneeze of a person who has the virus.  Touching something that has the virus on it (is contaminated) and then touching your mouth, nose, or eyes. What increases the risk? Certain things may make you more likely to get the flu. These include:  Not washing your hands often.  Having close contact with many people during cold and flu season.  Touching your mouth, eyes, or nose without first washing your hands.  Not getting a flu shot every year. You may have a higher risk for the flu, along with serious problems such as a lung infection (pneumonia), if you:  Are older than 65.  Are pregnant.  Have a weakened disease-fighting system (immune system) because of a disease or taking certain medicines.  Have a long-term (chronic) illness, such as: ? Heart, kidney, or lung disease. ? Diabetes. ? Asthma.  Have a liver disorder.  Are very overweight (morbidly obese).  Have anemia. This is a condition that affects your red blood cells. What are the signs or symptoms? Symptoms usually begin suddenly and last 4-14 days. They may include:  Fever and chills.  Headaches, body aches, or muscle aches.  Sore throat.  Cough.  Runny or stuffy (congested) nose.  Chest discomfort.  Not wanting to eat as much as normal (poor appetite).  Weakness or feeling tired (fatigue).  Dizziness.  Feeling sick to your stomach (nauseous) or  throwing up (vomiting). How is this treated? If the flu is found early, you can be treated with medicine that can help reduce how bad the illness is and how long it lasts (antiviral medicine). This may be given by mouth (orally) or through an IV tube. Taking care of yourself at home can help your symptoms get better. Your doctor may suggest:  Taking over-the-counter medicines.  Drinking plenty of fluids. The flu often goes away on its own. If you have very bad symptoms or other problems, you may be treated in a hospital. Follow these instructions at home:     Activity  Rest as needed. Get plenty of sleep.  Stay home from work or school as told by your doctor. ? Do not leave home until you do not have a fever for 24 hours without taking medicine. ? Leave home only to visit your doctor. Eating and drinking  Take an ORS (oral rehydration solution). This is a drink that is sold at pharmacies and stores.  Drink enough fluid to keep your pee (urine) pale yellow.  Drink clear fluids in small amounts as you are able. Clear fluids include: ? Water. ? Ice chips. ? Fruit juice that has water added (diluted fruit juice). ? Low-calorie sports drinks.  Eat bland, easy-to-digest foods in small amounts as you are able. These foods include: ? Bananas. ? Applesauce. ? Rice. ? Lean meats. ? Toast. ? Crackers.  Do not eat or drink: ? Fluids that have a lot  of sugar or caffeine. ? Alcohol. ? Spicy or fatty foods. General instructions  Take over-the-counter and prescription medicines only as told by your doctor.  Use a cool mist humidifier to add moisture to the air in your home. This can make it easier for you to breathe.  Cover your mouth and nose when you cough or sneeze.  Wash your hands with soap and water often, especially after you cough or sneeze. If you cannot use soap and water, use alcohol-based hand sanitizer.  Keep all follow-up visits as told by your doctor. This is  important. How is this prevented?   Get a flu shot every year. You may get the flu shot in late summer, fall, or winter. Ask your doctor when you should get your flu shot.  Avoid contact with people who are sick during fall and winter (cold and flu season). Contact a doctor if:  You get new symptoms.  You have: ? Chest pain. ? Watery poop (diarrhea). ? A fever.  Your cough gets worse.  You start to have more mucus.  You feel sick to your stomach.  You throw up. Get help right away if you:  Have shortness of breath.  Have trouble breathing.  Have skin or nails that turn a bluish color.  Have very bad pain or stiffness in your neck.  Get a sudden headache.  Get sudden pain in your face or ear.  Cannot eat or drink without throwing up. Summary  Influenza ("the flu") is an infection in the lungs, nose, and throat. It is caused by a virus.  Take over-the-counter and prescription medicines only as told by your doctor.  Getting a flu shot every year is the best way to avoid getting the flu. This information is not intended to replace advice given to you by your health care provider. Make sure you discuss any questions you have with your health care provider. Document Released: 09/27/2007 Document Revised: 06/05/2017 Document Reviewed: 06/05/2017 Elsevier Interactive Patient Education  2019 [employer]     Fetal Movement Counts Patient Name: ________________________________________________ Patient Due Date: ____________________ What is a fetal movement count?  A fetal movement count is the number of times that you feel your baby move during a certain amount of time. This may also be called a fetal kick count. A fetal movement count is recommended for every pregnant woman. You may be asked to start counting fetal movements as early as week 28 of your pregnancy. Pay attention to when your baby is most active. You may notice your baby's sleep and wake cycles. You may  also notice things that make your baby move more. You should do a fetal movement count:  When your baby is normally most active.  At the same time each day. A good time to count movements is while you are resting, after having something to eat and drink. How do I count fetal movements? 1. Find a quiet, comfortable area. Sit, or lie down on your side. 2. Write down the date, the start time and stop time, and the number of movements that you felt between those two times. Take this information with you to your health care visits. 3. For 2 hours, count kicks, flutters, swishes, rolls, and jabs. You should feel at least 10 movements during 2 hours. 4. You may stop counting after you have felt 10 movements. 5. If you do not feel 10 movements in 2 hours, have something to eat and drink. Then, keep resting and counting  for 1 hour. If you feel at least 4 movements during that hour, you may stop counting. Contact a health care provider if:  You feel fewer than 4 movements in 2 hours.  Your baby is not moving like he or she usually does. Date: ____________ Start time: ____________ Stop time: ____________ Movements: ____________ Date: ____________ Start time: ____________ Stop time: ____________ Movements: ____________ Date: ____________ Start time: ____________ Stop time: ____________ Movements: ____________ Date: ____________ Start time: ____________ Stop time: ____________ Movements: ____________ Date: ____________ Start time: ____________ Stop time: ____________ Movements: ____________ Date: ____________ Start time: ____________ Stop time: ____________ Movements: ____________ Date: ____________ Start time: ____________ Stop time: ____________ Movements: ____________ Date: ____________ Start time: ____________ Stop time: ____________ Movements: ____________ Date: ____________ Start time: ____________ Stop time: ____________ Movements: ____________ This information is not intended to replace advice  given to you by your health care provider. Make sure you discuss any questions you have with your health care provider. Document Released: 01/17/2006 Document Revised: 08/17/2015 Document Reviewed: 01/27/2015 Elsevier Interactive Patient Education  2019 [employer]

## 2018-02-14 ENCOUNTER — Encounter (HOSPITAL_COMMUNITY): Payer: Self-pay

## 2018-02-18 ENCOUNTER — Other Ambulatory Visit (HOSPITAL_COMMUNITY): Payer: Self-pay | Admitting: Obstetrics & Gynecology

## 2018-02-18 DIAGNOSIS — O365931 Maternal care for other known or suspected poor fetal growth, third trimester, fetus 1: Secondary | ICD-10-CM

## 2018-02-18 DIAGNOSIS — Z3A34 34 weeks gestation of pregnancy: Secondary | ICD-10-CM

## 2018-02-19 ENCOUNTER — Encounter (HOSPITAL_COMMUNITY): Payer: Self-pay

## 2018-02-20 ENCOUNTER — Other Ambulatory Visit: Payer: Self-pay | Admitting: Dentist

## 2018-02-20 ENCOUNTER — Other Ambulatory Visit (HOSPITAL_COMMUNITY): Payer: Self-pay | Admitting: Dentist

## 2018-02-20 DIAGNOSIS — M26603 Bilateral temporomandibular joint disorder, unspecified: Secondary | ICD-10-CM

## 2018-02-21 ENCOUNTER — Ambulatory Visit (HOSPITAL_COMMUNITY)
Admission: RE | Admit: 2018-02-21 | Discharge: 2018-02-21 | Disposition: A | Discharge status: Home / Self Care | Payer: Medicaid Other | Source: Ambulatory Visit | Attending: Obstetrics & Gynecology | Admitting: Obstetrics & Gynecology

## 2018-02-21 ENCOUNTER — Other Ambulatory Visit (HOSPITAL_COMMUNITY): Payer: Self-pay | Admitting: Obstetrics & Gynecology

## 2018-02-21 ENCOUNTER — Encounter (HOSPITAL_COMMUNITY): Payer: Self-pay

## 2018-02-21 DIAGNOSIS — Z3A33 33 weeks gestation of pregnancy: Secondary | ICD-10-CM | POA: Diagnosis not present

## 2018-02-21 DIAGNOSIS — O365931 Maternal care for other known or suspected poor fetal growth, third trimester, fetus 1: Secondary | ICD-10-CM

## 2018-02-21 DIAGNOSIS — Z3A34 34 weeks gestation of pregnancy: Secondary | ICD-10-CM

## 2018-02-21 DIAGNOSIS — Z363 Encounter for antenatal screening for malformations: Secondary | ICD-10-CM | POA: Diagnosis not present

## 2018-02-21 DIAGNOSIS — O26843 Uterine size-date discrepancy, third trimester: Secondary | ICD-10-CM

## 2018-02-21 HISTORY — DX: Unspecified hemorrhoids: K64.9

## 2018-02-21 HISTORY — DX: Dislocation of jaw, unspecified side, initial encounter: S03.00XA

## 2018-02-26 ENCOUNTER — Encounter (HOSPITAL_COMMUNITY): Payer: Self-pay

## 2018-02-26 ENCOUNTER — Ambulatory Visit (HOSPITAL_COMMUNITY): Admission: RE | Admit: 2018-02-26 | Payer: Medicaid Other | Source: Ambulatory Visit

## 2018-03-05 LAB — OB RESULTS CONSOLE GBS: GBS: NEGATIVE

## 2018-03-19 ENCOUNTER — Encounter (HOSPITAL_COMMUNITY): Payer: Self-pay | Admitting: *Deleted

## 2018-03-19 ENCOUNTER — Other Ambulatory Visit: Payer: Self-pay | Admitting: Obstetrics and Gynecology

## 2018-03-19 ENCOUNTER — Telehealth (HOSPITAL_COMMUNITY): Payer: Self-pay | Admitting: *Deleted

## 2018-03-19 NOTE — Telephone Encounter (Signed)
Preadmission screen Interpreter number [phone]

## 2018-03-21 ENCOUNTER — Inpatient Hospital Stay (HOSPITAL_COMMUNITY): Payer: Medicaid Other | Admitting: Anesthesiology

## 2018-03-21 ENCOUNTER — Inpatient Hospital Stay (HOSPITAL_COMMUNITY)
Admission: AD | Admit: 2018-03-21 | Discharge: 2018-03-23 | DRG: 807 | Disposition: A | Discharge status: Home / Self Care | Payer: Medicaid Other | Source: Intra-hospital | Attending: Obstetrics and Gynecology | Admitting: Obstetrics and Gynecology

## 2018-03-21 ENCOUNTER — Other Ambulatory Visit: Payer: Self-pay

## 2018-03-21 ENCOUNTER — Encounter (HOSPITAL_COMMUNITY): Payer: Self-pay

## 2018-03-21 DIAGNOSIS — Z3A38 38 weeks gestation of pregnancy: Secondary | ICD-10-CM | POA: Diagnosis not present

## 2018-03-21 DIAGNOSIS — O365930 Maternal care for other known or suspected poor fetal growth, third trimester, not applicable or unspecified: Principal | ICD-10-CM | POA: Diagnosis present

## 2018-03-21 DIAGNOSIS — O26893 Other specified pregnancy related conditions, third trimester: Secondary | ICD-10-CM | POA: Diagnosis present

## 2018-03-21 DIAGNOSIS — O43123 Velamentous insertion of umbilical cord, third trimester: Secondary | ICD-10-CM | POA: Diagnosis present

## 2018-03-21 LAB — ABO/RH: ABO/RH(D): B POS

## 2018-03-21 LAB — CBC
HCT: 33.6 % — ABNORMAL LOW (ref 36.0–46.0)
Hemoglobin: 10.5 g/dL — ABNORMAL LOW (ref 12.0–15.0)
MCH: 26.1 pg (ref 26.0–34.0)
MCHC: 31.3 g/dL (ref 30.0–36.0)
MCV: 83.4 fL (ref 80.0–100.0)
Platelets: 176 10*3/uL (ref 150–400)
RBC: 4.03 MIL/uL (ref 3.87–5.11)
RDW: 15.1 % (ref 11.5–15.5)
WBC: 6.3 10*3/uL (ref 4.0–10.5)
nRBC: 0 % (ref 0.0–0.2)

## 2018-03-21 LAB — TYPE AND SCREEN
ABO/RH(D): B POS
Antibody Screen: NEGATIVE

## 2018-03-21 LAB — GLUCOSE, CAPILLARY: Glucose-Capillary: 71 mg/dL (ref 70–99)

## 2018-03-21 MED ORDER — SODIUM CHLORIDE (PF) 0.9 % IJ SOLN
INTRAMUSCULAR | Status: DC | PRN
  Administered 2018-03-21: 12 mL/h via EPIDURAL

## 2018-03-21 MED ORDER — TERBUTALINE SULFATE 1 MG/ML IJ SOLN: 0.2500 mg | Freq: Once | INTRAMUSCULAR | Status: DC | PRN

## 2018-03-21 MED ORDER — MISOPROSTOL 25 MCG QUARTER TABLET
25.0000 ug | ORAL_TABLET | ORAL | Status: DC | PRN
  Administered 2018-03-21 (×2): 25 ug via VAGINAL
  Filled 2018-03-21 (×2): qty 1

## 2018-03-21 MED ORDER — OXYTOCIN 40 UNITS IN NORMAL SALINE INFUSION - SIMPLE MED
1.0000 m[IU]/min | INTRAVENOUS | Status: DC
  Administered 2018-03-21: 2 m[IU]/min via INTRAVENOUS

## 2018-03-21 MED ORDER — LIDOCAINE HCL (PF) 1 % IJ SOLN
INTRAMUSCULAR | Status: DC | PRN
  Administered 2018-03-21: 6 mL via EPIDURAL

## 2018-03-21 MED ORDER — OXYTOCIN BOLUS FROM INFUSION
500.0000 mL | Freq: Once | INTRAVENOUS | Status: AC
  Administered 2018-03-22 (×2): 500 mL via INTRAVENOUS

## 2018-03-21 MED ORDER — FENTANYL CITRATE (PF) 100 MCG/2ML IJ SOLN: 50.0000 ug | INTRAMUSCULAR | Status: DC | PRN

## 2018-03-21 MED ORDER — SOD CITRATE-CITRIC ACID 500-334 MG/5ML PO SOLN: 30.0000 mL | ORAL | Status: DC | PRN

## 2018-03-21 MED ORDER — TERBUTALINE SULFATE 1 MG/ML IJ SOLN
0.2500 mg | Freq: Once | INTRAMUSCULAR | Status: DC | PRN
  Filled 2018-03-21: qty 1

## 2018-03-21 MED ORDER — FENTANYL-BUPIVACAINE-NACL 0.5-0.125-0.9 MG/250ML-% EP SOLN
12.0000 mL/h | EPIDURAL | Status: DC | PRN
  Filled 2018-03-21: qty 250

## 2018-03-21 MED ORDER — PHENYLEPHRINE 40 MCG/ML (10ML) SYRINGE FOR IV PUSH (FOR BLOOD PRESSURE SUPPORT)
80.0000 ug | PREFILLED_SYRINGE | INTRAVENOUS | Status: DC | PRN
  Filled 2018-03-21: qty 10

## 2018-03-21 MED ORDER — OXYTOCIN 40 UNITS IN NORMAL SALINE INFUSION - SIMPLE MED: 2.5000 [IU]/h | INTRAVENOUS | Status: DC

## 2018-03-21 MED ORDER — OXYTOCIN 40 UNITS IN NORMAL SALINE INFUSION - SIMPLE MED
1.0000 m[IU]/min | INTRAVENOUS | Status: DC
  Filled 2018-03-21: qty 1000

## 2018-03-21 MED ORDER — DIPHENHYDRAMINE HCL 50 MG/ML IJ SOLN: 12.5000 mg | INTRAMUSCULAR | Status: DC | PRN

## 2018-03-21 MED ORDER — FLEET ENEMA 7-19 GM/118ML RE ENEM: 1.0000 | ENEMA | RECTAL | Status: DC | PRN

## 2018-03-21 MED ORDER — LACTATED RINGERS IV SOLN
INTRAVENOUS | Status: DC
  Administered 2018-03-21 – 2018-03-22 (×4): via INTRAVENOUS

## 2018-03-21 MED ORDER — ACETAMINOPHEN 325 MG PO TABS: 650.0000 mg | ORAL_TABLET | ORAL | Status: DC | PRN

## 2018-03-21 MED ORDER — ONDANSETRON HCL 4 MG/2ML IJ SOLN: 4.0000 mg | Freq: Four times a day (QID) | INTRAMUSCULAR | Status: DC | PRN

## 2018-03-21 MED ORDER — FENTANYL-BUPIVACAINE-NACL 0.5-0.125-0.9 MG/250ML-% EP SOLN: 12.0000 mL/h | EPIDURAL | Status: DC | PRN

## 2018-03-21 MED ORDER — EPHEDRINE 5 MG/ML INJ: 10.0000 mg | INTRAVENOUS | Status: DC | PRN

## 2018-03-21 MED ORDER — LIDOCAINE HCL (PF) 1 % IJ SOLN: 30.0000 mL | INTRAMUSCULAR | Status: DC | PRN

## 2018-03-21 MED ORDER — LACTATED RINGERS IV SOLN: 500.0000 mL | INTRAVENOUS | Status: DC | PRN

## 2018-03-21 MED ORDER — PHENYLEPHRINE 40 MCG/ML (10ML) SYRINGE FOR IV PUSH (FOR BLOOD PRESSURE SUPPORT): 80.0000 ug | PREFILLED_SYRINGE | INTRAVENOUS | Status: DC | PRN

## 2018-03-21 MED ORDER — LACTATED RINGERS IV SOLN: 500.0000 mL | Freq: Once | INTRAVENOUS | Status: DC

## 2018-03-21 NOTE — H&P (Signed)
Dawn Torres is a 28 y.o. female presenting for IOL at 38 weeks. Pt is without complaint OB History    Gravida  3   Para  2   Term  2   Preterm  0   AB  0   Living  2     SAB  0   TAB  0   Ectopic  0   Multiple  0   Live Births  2          Past Medical History:  Diagnosis Date  . Hemorrhoids   . Medical history non-contributory   . TMJ (dislocation of temporomandibular joint)    Past Surgical History:  Procedure Laterality Date  . NO PAST SURGERIES     Family History: family history is not on file. Social History:  reports that she has never smoked. She has never used smokeless tobacco. She reports that she does not drink alcohol or use drugs.     Maternal Diabetes: No Genetic Screening: Normal Maternal Ultrasounds/Referrals: Abnormal:  Findings:   IUGR Fetal Ultrasounds or other Referrals:  None Maternal Substance Abuse:  No Significant Maternal Medications:  None Significant Maternal Lab Results:  None Other Comments:  None  ROS History Dilation: Closed Station: -3 Exam by:: Yancey Manhard RNC  Blood pressure (!) 94/40, pulse 78, temperature 97.9 F (36.6 C), temperature source Oral, resp. rate 18, height 5' (1.524 m), weight 79.4 kg, last menstrual period 07/05/2017, unknown if currently breastfeeding. Exam Physical Exam  Physical Examination: General appearance - alert, well appearing, and in no distress and oriented to person, place, and time Mental status - alert, oriented to person, place, and time Chest - clear to auscultation, no wheezes, rales or rhonchi, symmetric air entry Heart - normal rate and regular rhythm Abdomen - soft, nontender, nondistended, no masses or organomegaly gravid Extremities - Homan's sign negative bilaterally  Prenatal labs: ABO, Rh: --/--/B POS, B POS Performed at [facility], [address] [place], [place] [address]  [phone]/20 1006) Antibody: NEG (03/20 1006) Rubella: Immune (09/11 0000) RPR:  Nonreactive (09/11 0000)  HBsAg: Negative (09/11 0000)  HIV: Non-reactive (09/11 0000)  GBS: Negative (03/04 0000)   Assessment/Plan: IUGR AT 38 WEEKS.  EFW 5-13 WITH NORMAL DOPPLERS ON 3/17 CAT                       1 GBS NEG CYTOTEC FOR CERVICAL RIPENING ANTICIPATE SVD    Dawn Torres 03/21/2018, 1:03 PM

## 2018-03-21 NOTE — Progress Notes (Signed)
Labor Progress Note  Dawn Torres, 28 y.o., [id number], with an IUP @ [redacted]w[redacted]d, presented for IOL for IUGR, 10%, EFW 5.13 lbs , GBS -., Baby female, undecided for circ. H/O SGA babies.   Subjective: Pt resting quietly and alone in room, pt stated husband on the way, pt endorses starting to feel cxt in her lower back. Talked about baby boy circ, pt waiting for husband to see if they desire PP circ for baby female. Pt aware of R&B of induction, Second dose of Cytotec to be placed at 1445.  Patient Active Problem List   Diagnosis Date Noted  . IUGR (intrauterine growth restriction) affecting care of mother, third trimester, not applicable or unspecified fetus 03/21/2018  . Anemia 02/12/2015  . Language barrier 02/10/2015   Objective: BP (!) 94/40   Pulse 78   Temp 97.9 F (36.6 C) (Oral)   Resp 18   Ht 5' (1.524 m)   Wt 79.4 kg   LMP 07/05/2017   BMI 34.18 kg/m  No intake/output data recorded. No intake/output data recorded. NST: FHR baseline 125 bpm, Variability: moderate, Accelerations:present, Decelerations:  Absent= Cat 1/Reactive CTX:  irregular, every 5/8 minutes, lasting 80-120 seconds Uterus gravid, soft non tender, mild to palpate with contractions.  SVE:  Dilation: Closed Station: -3 Exam by:: Ashley Mariner RNC  Pitocin at (Not indicated yet) mUn/min  Assessment:  Dawn Torres, 28 y.o., [id number], with an IUP @ [redacted]w[redacted]d, presented for IOL for IUGR, 10%, EFW 5.13 lbs , GBS -., Baby female, undecided for circ.H/O SGA babies. Pt stable. Patient Active Problem List   Diagnosis Date Noted  . IUGR (intrauterine growth restriction) affecting care of mother, third trimester, not applicable or unspecified fetus 03/21/2018  . Anemia 02/12/2015  . Language barrier 02/10/2015   NICHD: Category 1  Membranes:  Intact, no s/s of infection  Induction:    Cytotec x1  Foley Bulb: Not yet  Pitocin - Not yet  Pain management:               IV pain management: PRN  Nitrous: PRN  Epidural placement: PRN  GBS Negative  Plan: Continue labor plan Continuous/intermittent monitoring Rest Ambulate Frequent position changes to facilitate fetal rotation and descent. Will reassess with cervical exam in 4 hours or earlier if necessary Plan to place second Cytotec at 1445. Plan to start pitocin per protocol once cervix is ripe Anticipate AROM when fetus head is well applied to cervix.  Anticipate labor progression and vaginal delivery.   Md Dillard to be updated PRN  Dale [place], NP-C, CNM, MSN 03/21/2018. 2:47 PM

## 2018-03-21 NOTE — Progress Notes (Signed)
Labor Progress Note  Dawn Torres, 28 y.o., [id number], with an IUP @ [redacted]w[redacted]d, presented for IOL for IUGR, 10%, EFW 5.13 lbs , GBS -., Baby female, undecided for circ. H/O SGA babies.   Subjective: Pt resting quietly in room with husband at bedside. Pt comfortab;e post epidural placement.  Patient Active Problem List   Diagnosis Date Noted  . IUGR (intrauterine growth restriction) affecting care of mother, third trimester, not applicable or unspecified fetus 03/21/2018  . Anemia 02/12/2015  . Language barrier 02/10/2015   Objective: BP 114/61   Pulse 76   Temp 97.9 F (36.6 C) (Oral)   Resp 18   Ht 5' (1.524 m)   Wt 79.4 kg   LMP 07/05/2017   SpO2 100%   BMI 34.18 kg/m  No intake/output data recorded. No intake/output data recorded. NST: FHR baseline 125 bpm, Variability: moderate, Accelerations:present, Decelerations:  Absent= Cat 1/Reactive CTX:  irregular, every 2-5 minutes, lasting 50-80 seconds Uterus gravid, soft non tender, mild to palpate with contractions.  SVE:  Dilation: 2.5 Effacement (%): 80 Station: -2 Exam by:: heather koran rnc Pitocin at (Not indicated yet) mUn/min  Assessment:  Dawn Torres, 27 y.o., G3P2002, with an IUP @ [redacted]w[redacted]d, presented for IOL for IUGR, 10%, EFW 5.13 lbs , GBS -., Baby female,out pt circ. H/O SGA babies. Pt stable. Comfortable post epidural placement. SROM. Stable. Patient Active Problem List   Diagnosis Date Noted  . IUGR (intrauterine growth restriction) affecting care of mother, third trimester, not applicable or unspecified fetus 03/21/2018  . Anemia 02/12/2015  . Language barrier 02/10/2015   NICHD: Category 1  Membranes:  SROM @ 1740, clear, mod on 3/20, no s/s of infection  Induction:    Cytotec x2  Foley Bulb: No  Pitocin - Not yet  Pain management:               Epidural placement: Placed @ 1940 on 3/20  GBS Negative  Plan: Continue labor plan Continuous monitoring Rest Frequent position changes to facilitate fetal  rotation and descent. Will reassess with cervical exam in 4 hours or earlier if necessary Start pitocin now per protocol for cxt spaced Anticipate labor progression and vaginal delivery.   Md Dillard to be updated PRN  Dale , NP-C, CNM, MSN 03/21/2018. 8:15 PM

## 2018-03-21 NOTE — Anesthesia Procedure Notes (Signed)
Epidural Patient location during procedure: OB Start time: 03/21/2018 5:15 PM End time: 03/21/2018 5:20 PM  Staffing Anesthesiologist: Bethena Midget, MD  Preanesthetic Checklist Completed: patient identified, site marked, surgical consent, pre-op evaluation, timeout performed, IV checked, risks and benefits discussed and monitors and equipment checked  Epidural Patient position: sitting Prep: site prepped and draped and DuraPrep Patient monitoring: continuous pulse ox and blood pressure Approach: midline Location: L3-L4 Injection technique: LOR air  Needle:  Needle type: Tuohy  Needle gauge: 17 G Needle length: 9 cm and 9 Needle insertion depth: 5 cm cm Catheter type: closed end flexible Catheter size: 19 Gauge Catheter at skin depth: 10 cm Test dose: negative  Assessment Events: blood not aspirated, injection not painful, no injection resistance, negative IV test and no paresthesia

## 2018-03-21 NOTE — Anesthesia Preprocedure Evaluation (Signed)
Anesthesia Evaluation  Patient identified by MRN, date of birth, ID band Patient awake    Reviewed: Allergy & Precautions, H&P , NPO status , Patient's Chart, lab work & pertinent test results, reviewed documented beta blocker date and time   Airway Mallampati: II  TM Distance: >3 FB Neck ROM: full    Dental no notable dental hx.    Pulmonary neg pulmonary ROS,    Pulmonary exam normal breath sounds clear to auscultation       Cardiovascular negative cardio ROS Normal cardiovascular exam Rhythm:regular Rate:Normal     Neuro/Psych negative neurological ROS  negative psych ROS   GI/Hepatic negative GI ROS, Neg liver ROS,   Endo/Other  negative endocrine ROS  Renal/GU negative Renal ROS  negative genitourinary   Musculoskeletal   Abdominal   Peds  Hematology negative hematology ROS (+)   Anesthesia Other Findings   Reproductive/Obstetrics (+) Pregnancy                             Anesthesia Physical Anesthesia Plan  ASA: II  Anesthesia Plan: Epidural   Post-op Pain Management:    Induction:   PONV Risk Score and Plan:   Airway Management Planned:   Additional Equipment:   Intra-op Plan:   Post-operative Plan:   Informed Consent: I have reviewed the patients History and Physical, chart, labs and discussed the procedure including the risks, benefits and alternatives for the proposed anesthesia with the patient or authorized representative who has indicated his/her understanding and acceptance.     Dental Advisory Given  Plan Discussed with:   Anesthesia Plan Comments: (Labs checked- platelets confirmed with RN in room. Fetal heart tracing, per RN, reported to be stable enough for sitting procedure. Discussed epidural, and patient consents to the procedure:  included risk of possible headache,backache, failed block, allergic reaction, and nerve injury. This patient was asked  if she had any questions or concerns before the procedure started.)        Anesthesia Quick Evaluation  

## 2018-03-21 NOTE — Progress Notes (Signed)
Labor Progress Note  Dawn Torres, 28 y.o., [id number], with an IUP @ [redacted]w[redacted]d, presented for IOL for IUGR, 10%, EFW 5.13 lbs , GBS -., Baby female, undecided for circ. H/O SGA babies.   Subjective: Pt getting in pt about having the baby, pt keeps asking if it is time, I informed pt she does not have enough cxt for labor to really start ror make cervical change right now, pt wants to be checked. Pitocin has not been started yet, RN aware and will start as soon as she can.  Pt comfortable with epidural.  Patient Active Problem List   Diagnosis Date Noted  . IUGR (intrauterine growth restriction) affecting care of mother, third trimester, not applicable or unspecified fetus 03/21/2018  . Anemia 02/12/2015  . Language barrier 02/10/2015   Objective: BP 114/61   Pulse 76   Temp 97.9 F (36.6 C) (Oral)   Resp 18   Ht 5' (1.524 m)   Wt 79.4 kg   LMP 07/05/2017   SpO2 100%   BMI 34.18 kg/m  No intake/output data recorded. No intake/output data recorded. NST: FHR baseline 130 bpm, Variability: moderate, Accelerations:present, Decelerations:  Absent= Cat 1/Reactive CTX:  irregular, every 2-6 minutes, lasting 50-80 seconds Uterus gravid, soft non tender, mild to palpate with contractions.   Unchanged.  SVE:  Dilation: 2.5 Effacement (%): 80 Station: -2 Exam by:: [employer], cnm Pitocin at (Not indicated yet) mUn/min  Assessment:  Dawn Torres, 27 y.o., G3P2002, with an IUP @ [redacted]w[redacted]d, presented for IOL for IUGR, 10%, EFW 5.13 lbs , GBS -., Baby female,out pt circ. H/O SGA babies. Pt stable. Comfortable post epidural placement. SROM. Stable. Pt requested to be checked, no change, RN to start pitocin as soon as she can.  Patient Active Problem List   Diagnosis Date Noted  . IUGR (intrauterine growth restriction) affecting care of mother, third trimester, not applicable or unspecified fetus 03/21/2018  . Anemia 02/12/2015  . Language barrier 02/10/2015   NICHD: Category 1  Membranes:  SROM @  1740, clear, mod on 3/20, no s/s of infection  Induction:    Cytotec x2  Foley Bulb: No  Pitocin - Not yet  Pain management:               Epidural placement: Placed @ 1940 on 3/20  GBS Negative  Plan: Continue labor plan Continuous monitoring Rest Frequent position changes to facilitate fetal rotation and descent. Will reassess with cervical exam in 4 hours or earlier if necessary Start pitocin now per protocol for cxt spaced Anticipate labor progression and vaginal delivery.   Md Dillard updated.   Dale [place], NP-C, CNM, MSN 03/21/2018. 9:51 PM

## 2018-03-21 NOTE — Progress Notes (Signed)
Used stratus. Dawn Torres [phone]

## 2018-03-22 ENCOUNTER — Encounter (HOSPITAL_COMMUNITY): Payer: Self-pay | Admitting: *Deleted

## 2018-03-22 LAB — RPR: RPR Ser Ql: NONREACTIVE

## 2018-03-22 MED ORDER — SODIUM CHLORIDE 0.9 % IV SOLN
INTRAVENOUS | Status: DC
  Administered 2018-03-22: 19:00:00 via INTRAVENOUS

## 2018-03-22 MED ORDER — ZOLPIDEM TARTRATE 5 MG PO TABS: 5.0000 mg | ORAL_TABLET | Freq: Every evening | ORAL | Status: DC | PRN

## 2018-03-22 MED ORDER — ONDANSETRON HCL 4 MG/2ML IJ SOLN: 4.0000 mg | INTRAMUSCULAR | Status: DC | PRN

## 2018-03-22 MED ORDER — ONDANSETRON HCL 4 MG PO TABS: 4.0000 mg | ORAL_TABLET | ORAL | Status: DC | PRN

## 2018-03-22 MED ORDER — SENNOSIDES-DOCUSATE SODIUM 8.6-50 MG PO TABS
2.0000 | ORAL_TABLET | ORAL | Status: DC
  Administered 2018-03-22: 2 via ORAL
  Filled 2018-03-22: qty 2

## 2018-03-22 MED ORDER — ACETAMINOPHEN 325 MG PO TABS: 650.0000 mg | ORAL_TABLET | ORAL | Status: DC | PRN

## 2018-03-22 MED ORDER — WITCH HAZEL-GLYCERIN EX PADS: 1.0000 "application " | MEDICATED_PAD | CUTANEOUS | Status: DC | PRN

## 2018-03-22 MED ORDER — METHYLERGONOVINE MALEATE 0.2 MG PO TABS
0.2000 mg | ORAL_TABLET | Freq: Four times a day (QID) | ORAL | Status: DC
  Administered 2018-03-22 – 2018-03-23 (×3): 0.2 mg via ORAL
  Filled 2018-03-22 (×3): qty 1

## 2018-03-22 MED ORDER — TETANUS-DIPHTH-ACELL PERTUSSIS 5-2.5-18.5 LF-MCG/0.5 IM SUSP: 0.5000 mL | Freq: Once | INTRAMUSCULAR | Status: DC

## 2018-03-22 MED ORDER — METHYLERGONOVINE MALEATE 0.2 MG/ML IJ SOLN: 0.2000 mg | INTRAMUSCULAR | Status: DC | PRN

## 2018-03-22 MED ORDER — DIPHENHYDRAMINE HCL 25 MG PO CAPS: 25.0000 mg | ORAL_CAPSULE | Freq: Four times a day (QID) | ORAL | Status: DC | PRN

## 2018-03-22 MED ORDER — PRENATAL MULTIVITAMIN CH
1.0000 | ORAL_TABLET | Freq: Every day | ORAL | Status: DC
  Administered 2018-03-23: 1 via ORAL
  Filled 2018-03-22: qty 1

## 2018-03-22 MED ORDER — COCONUT OIL OIL: 1.0000 "application " | TOPICAL_OIL | Status: DC | PRN

## 2018-03-22 MED ORDER — BENZOCAINE-MENTHOL 20-0.5 % EX AERO
1.0000 "application " | INHALATION_SPRAY | CUTANEOUS | Status: DC | PRN
  Administered 2018-03-23: 1 via TOPICAL
  Filled 2018-03-22: qty 56

## 2018-03-22 MED ORDER — METHYLERGONOVINE MALEATE 0.2 MG/ML IJ SOLN
0.2000 mg | Freq: Four times a day (QID) | INTRAMUSCULAR | Status: DC
  Administered 2018-03-22: 0.2 mg via INTRAMUSCULAR

## 2018-03-22 MED ORDER — DIBUCAINE 1 % RE OINT: 1.0000 "application " | TOPICAL_OINTMENT | RECTAL | Status: DC | PRN

## 2018-03-22 MED ORDER — SIMETHICONE 80 MG PO CHEW: 80.0000 mg | CHEWABLE_TABLET | ORAL | Status: DC | PRN

## 2018-03-22 MED ORDER — METHYLERGONOVINE MALEATE 0.2 MG PO TABS: 0.2000 mg | ORAL_TABLET | ORAL | Status: DC | PRN

## 2018-03-22 MED ORDER — IBUPROFEN 600 MG PO TABS
600.0000 mg | ORAL_TABLET | Freq: Four times a day (QID) | ORAL | Status: DC
  Administered 2018-03-22 – 2018-03-23 (×5): 600 mg via ORAL
  Filled 2018-03-22 (×5): qty 1

## 2018-03-22 MED ORDER — SODIUM CHLORIDE 0.9 % IV SOLN
3.0000 g | Freq: Four times a day (QID) | INTRAVENOUS | Status: AC
  Administered 2018-03-22 – 2018-03-23 (×4): 3 g via INTRAVENOUS
  Filled 2018-03-22 (×4): qty 3

## 2018-03-22 MED ORDER — METHYLERGONOVINE MALEATE 0.2 MG/ML IJ SOLN
INTRAMUSCULAR | Status: AC
  Filled 2018-03-22: qty 1

## 2018-03-22 NOTE — Plan of Care (Signed)

## 2018-03-22 NOTE — Progress Notes (Signed)
Arabic interpreter Shaymaa # [phone] used to explain plan of care to pt

## 2018-03-22 NOTE — Anesthesia Postprocedure Evaluation (Signed)
Anesthesia Post Note  Patient: Dawn Torres  Procedure(s) Performed: AN AD HOC LABOR EPIDURAL     Patient location during evaluation: Mother Baby Anesthesia Type: Epidural Level of consciousness: awake and alert, oriented and patient cooperative Pain management: pain level controlled Vital Signs Assessment: post-procedure vital signs reviewed and stable Respiratory status: spontaneous breathing, nonlabored ventilation and respiratory function stable Cardiovascular status: blood pressure returned to baseline and stable Postop Assessment: no apparent nausea or vomiting, patient able to bend at knees, no headache, no backache and epidural receding Anesthetic complications: no (epidural resolving, including transient femoral nerve paresis)    Last Vitals:  Vitals:   03/22/18 1419 03/22/18 1500  BP: (!) 111/59 113/70  Pulse: 74 78  Resp: 18 18  Temp: 36.9 C 37.1 C  SpO2:  100%    Last Pain:  Vitals:   03/22/18 1500  TempSrc: Oral  PainSc: 0-No pain   Pain Goal:                Epidural/Spinal Function Cutaneous sensation: Tingles(right leg still heavy) (03/22/18 1700), Patient able to flex knees: Yes (03/22/18 1700), Patient able to lift hips off bed: Yes (03/22/18 1700), Back pain beyond tenderness at insertion site: No (03/22/18 1700), Progressively worsening motor and/or sensory loss: No (03/22/18 1700), Bowel and/or bladder incontinence post epidural: No (03/22/18 1700)  Amiracle Neises,E. Courtez Twaddle

## 2018-03-22 NOTE — Lactation Note (Signed)
This note was copied from a baby's chart. Lactation Consultation Note  Patient Name: Dawn Torres Date: 03/22/2018 Reason for consult: Initial assessment;Early term 37-38.6wks;Infant < 6lbs  Used Youth worker for Arabic, Interpreter # [phone] "Dawn Torres"  38 hours old early term female who is being partially BF and formula fed by his mother, she's a P3 and experienced BF. She was able to BF her first baby for 15-16 months, and her second one for 18 months. Mom participated in the [facility] program at the [facility] and she's already familiar with hand expression, and has observed colostrum already. Mom doesn't have a pump at home, [facility] offered a hand pump from the hospital, instructions, cleaning and storage were reviewed, as well as milk storage guidelines.  Offered assistance with latch, but mom politely declined, baby was asleep in his bassinet, she told [place] he had already fed. Asked mom to call for assistance when needed. Reviewed feeding cues and normal newborn behavior. LC also asked mom if she would like to start double pumping early on while at the hospital since her baby is < 6 lbs. Baby is already getting supplemented with Gerber gentle, that was her feeding choice on admission to do both, breast and formula. Mom agreeable with pumping, but no pump available in the unit at this point. LC looked everywhere but no pump is available right now, the unit is nearly full. Asked her RN Dawn Torres to try to find one and set her up as soon as one becomes available. LC left DEBP pump kit, patient belonging bag, soap and basin in the room ready to go as soon as we get her a Symphony pump.  Feeding plan  1. Encouraged mom to feed baby every 3 hours or sooner if feeding cues are present 2. RN will set her up with a DEBP as soon as one becomes available 3. Mom will continue supplementing with Dory Horn formula as needed  BF brochure and feeding diary were reviewed. Mom reported all questions and concerns  were answered, she's aware of [place] services and will call PRN.   Maternal Data Formula Feeding for Exclusion: Yes Reason for exclusion: Mother's choice to formula and breast feed on admission Has patient been taught Hand Expression?: Yes Does the patient have breastfeeding experience prior to this delivery?: Yes  Feeding Feeding Type: Breast Fed  Interventions Interventions: Breast feeding basics reviewed;DEBP;Hand pump  Lactation Tools Discussed/Used Tools: Pump Breast pump type: Double-Electric Breast Pump;Manual WIC Program: Yes Pump Review: Setup, frequency, and cleaning;Milk Storage Initiated by:: MPeck  Date initiated:: 03/22/18   Consult Status Consult Status: Follow-up Date: 03/23/18 Follow-up type: In-patient    Dawn Torres Dawn Torres 03/22/2018, 9:35 PM

## 2018-03-22 NOTE — Progress Notes (Signed)
Labor Progress Note  Dawn Torres, 28 y.o., [id number], with an IUP @ [redacted]w[redacted]d, presented for IOL for IUGR, 10%, EFW 5.13 lbs , GBS -., Baby female, undecided for circ. H/O SGA babies.   Subjective: Pt in bed stable with husband at bedside, pt stated she feels really cold, with three blankets on. Requested RN to check pt tempeture. Pt is not shivering, and feels normothermia. Discussed with pt about R&B of an IUPC, pt stated she verbally consented to procedure. Due to elevated pitocin and no cervical change Patient Active Problem List   Diagnosis Date Noted  . IUGR (intrauterine growth restriction) affecting care of mother, third trimester, not applicable or unspecified fetus 03/21/2018  . Anemia 02/12/2015  . Language barrier 02/10/2015   Objective: BP 108/62   Pulse 70   Temp 97.6 F (36.4 C) (Oral)   Resp 18   Ht 5' (1.524 m)   Wt 79.4 kg   LMP 07/05/2017   SpO2 100%   BMI 34.18 kg/m  No intake/output data recorded. Total I/O In: 775 [Other:775] Out: -  NST: FHR baseline 120 bpm, Variability: moderate, Accelerations:present, Decelerations:  Absent= Cat 1/Reactive CTX:  irregular, every 1.5-6 minutes, lasting 50-100 seconds Uterus gravid, soft non tender, mild to palpate with contractions.   SVE:  Dilation: 3 Effacement (%): 70 Station: -1 Exam by:: [employer], CNM Pitocin at (24) mUn/min  IUPC placed, tolerated well.   Assessment:  Dawn Torres, 28 y.o., G3P2002, with an IUP @ [redacted]w[redacted]d, presented for IOL for IUGR, 10%, EFW 5.13 lbs , GBS -., Baby female,out pt circ. H/O SGA babies. Pt stable. Comfortable post epidural placement. Pt resting, IUPC placed, no real cervical change over night, pt c/o being cold, no sickness present.  Patient Active Problem List   Diagnosis Date Noted  . IUGR (intrauterine growth restriction) affecting care of mother, third trimester, not applicable or unspecified fetus 03/21/2018  . Anemia 02/12/2015  . Language barrier 02/10/2015   NICHD: Category  1  Membranes:  SROM @ 1740, clear, mod on 3/20, x 13 H no s/s of infection  Induction:    Cytotec x2  Foley Bulb: No  Pitocin - 24  Pain management:               Epidural placement: Placed @ 1940 on 3/20  GBS Negative  Plan: Continue labor plan Continuous monitoring Rest Frequent position changes to facilitate fetal rotation and descent. Will reassess with cervical exam in 4 hours or earlier if necessary Continue pitocin, titrate to 200 MVUs.  Anticipate labor progression and vaginal delivery.   Md Dillard to be updated at 0700 with Dawn Torres, CNM.   Dawn Baltic, NP-C, CNM, MSN 03/22/2018. 6:31 AM

## 2018-03-22 NOTE — Progress Notes (Signed)
Called to evaluate pt with persistent RLE weakness s/p LEA.  Dawn Torres is an healthy 28 year old who had uneventful Labor epidural placement, with Bupivacaine/fentanyl infusion for approx 15 hours. Delivery was uneventful, with the exception of successful post partum uterine exploration for retained placenta.   On exam: bilateral foot dorsiflexion and extension appear strong. Sensation to cold is diminished in the L2,3,4 distribution of the RLE, intact on the LLE. Likewise RLE quad function is present, but markedly diminished when compared to LLE.   Assessment: persistent RLE weakness and sensory deficit in the femoral nerve/quadriceps distribution. Causes could be pooling/concentration of local anesthetic on R during labor, possible femoral nerve compression/stretch during delivery/positioning.   Plan: Fall risk precautions until resolution. Will reassess this evening and discuss with OB.    Sandford Craze, MD

## 2018-03-22 NOTE — Progress Notes (Signed)
Interpreter used for admission education with patient and husband. Both said they do not need an interpreter for every staff interaction, but request one for procedures and educational purposes. Patient said they will let us know when an interpreter is needed if she feels like she doesn't understand something. Patient and husband have the interpreter refusal form/ family member interpreter form at bedside to complete. Royston Cowper, RN

## 2018-03-22 NOTE — Progress Notes (Signed)
Re-eval of Ms Fate's RLE quad weakness. She reports full sensation had returned to her RLE, including quadfemoral nerve distribution. Ms. Heffington is also able to flex thigh. She says her leg is much better. Femoral nerve weakness is resolving rapidly. Expect full recovery.   Sandford Craze, MD

## 2018-03-23 LAB — CBC
HCT: 33.4 % — ABNORMAL LOW (ref 36.0–46.0)
Hemoglobin: 9.9 g/dL — ABNORMAL LOW (ref 12.0–15.0)
MCH: 25.1 pg — ABNORMAL LOW (ref 26.0–34.0)
MCHC: 29.6 g/dL — ABNORMAL LOW (ref 30.0–36.0)
MCV: 84.6 fL (ref 80.0–100.0)
Platelets: 150 10*3/uL (ref 150–400)
RBC: 3.95 MIL/uL (ref 3.87–5.11)
RDW: 15.4 % (ref 11.5–15.5)
WBC: 7.7 10*3/uL (ref 4.0–10.5)
nRBC: 0 % (ref 0.0–0.2)

## 2018-03-23 MED ORDER — IBUPROFEN 600 MG PO TABS: 600.0000 mg | ORAL_TABLET | Freq: Four times a day (QID) | ORAL | 0 refills | Status: AC

## 2018-03-23 NOTE — Progress Notes (Signed)
Reviewed D/C instructions with patient using interpreter # [phone]. Patient verbalizes understanding and declines having any further questions. Royston Cowper, RN

## 2018-03-23 NOTE — Discharge Summary (Signed)
OB Discharge Summary     Patient Name: Dawn Torres DOB: 07-Jul-1990 MRN: [id number]  Date of admission: 03/21/2018 Delivering MD: Janeece Riggers   Date of discharge: 03/23/2018  Admitting diagnosis: pregnancy Intrauterine pregnancy: [redacted]w[redacted]d     Secondary diagnosis:  Active Problems:   IUGR (intrauterine growth restriction) affecting care of mother, third trimester, not applicable or unspecified fetus  Additional problems: Partial retained placenta     Discharge diagnosis: Term Pregnancy Delivered                                                                                                Post partum procedures:Methergine PO  Augmentation: AROM, Pitocin, Cytotec and Foley Balloon  Complications: None  Hospital course:  Induction of Labor With Vaginal Delivery   28 y.o. yo G3P3003 at [redacted]w[redacted]d was admitted to the hospital 03/21/2018 for induction of labor.  Indication for induction: IUGR.  Patient had an uncomplicated labor course as follows: Membrane Rupture Time/Date: 5:40 PM ,03/21/2018   Intrapartum Procedures: Episiotomy: None [1]                                         Lacerations:  1st degree [2]  Patient had delivery of a Viable infant.  Information for the patient's newborn:  Courntey, [place] [[id number]]  Delivery Method: Vaginal, Spontaneous(Filed from Delivery Summary)   03/22/2018  Details of delivery can be found in separate delivery note.  Patient had a routine postpartum course. Patient is discharged home 03/23/18.  Physical exam  Vitals:   03/22/18 1500 03/22/18 1842 03/22/18 2136 03/23/18 0600  BP: 113/70 (!) 121/51 114/75 113/60  Pulse: 78 72 75 64  Resp: 18 16 16 18   Temp: 98.7 F (37.1 C) 98.6 F (37 C) 98.1 F (36.7 C) 98.1 F (36.7 C)  TempSrc: Oral Oral Oral Oral  SpO2: 100% 100%  100%  Weight:      Height:       General: alert, cooperative and no distress Lochia: appropriate Uterine Fundus: firm Incision: N/A DVT Evaluation: No evidence of DVT  seen on physical exam. Labs: Lab Results  Component Value Date   WBC 7.7 03/23/2018   HGB 9.9 (L) 03/23/2018   HCT 33.4 (L) 03/23/2018   MCV 84.6 03/23/2018   PLT 150 03/23/2018   CMP Latest Ref Rng & Units 08/30/2017  Glucose 70 - 99 mg/dL 95  BUN 6 - 20 mg/dL 10  Creatinine 3.32 - 9.51 mg/dL 8.84  Sodium 166 - 063 mmol/L 133(L)  Potassium 3.5 - 5.1 mmol/L 3.7  Chloride 98 - 111 mmol/L 103  CO2 22 - 32 mmol/L 22  Calcium 8.9 - 10.3 mg/dL 9.1  Total Protein 6.5 - 8.1 g/dL 7.5  Total Bilirubin 0.3 - 1.2 mg/dL 0.3  Alkaline Phos 38 - 126 U/L 67  AST 15 - 41 U/L 14(L)  ALT 0 - 44 U/L 12    Discharge instruction: per After Visit Summary and "Baby and Me Booklet".  After visit  meds:  Allergies as of 03/23/2018   No Known Allergies     Medication List    STOP taking these medications   ondansetron 4 MG tablet Commonly known as:  Zofran     TAKE these medications   ibuprofen 600 MG tablet Commonly known as:  ADVIL,MOTRIN Take 1 tablet (600 mg total) by mouth every 6 (six) hours.   prenatal multivitamin Tabs tablet Take 1 tablet by mouth daily at 12 noon.       Diet: routine diet  Activity: Advance as tolerated. Pelvic rest for 6 weeks.   Outpatient follow up:6 weeks Follow up Appt:No future appointments. Follow up Visit:No follow-ups on file.  Postpartum contraception: Nexplanon  Newborn Data: Live born female  Birth Weight: 5 lb 8.9 oz (2520 g) APGAR: 9, 9  Newborn Delivery   Birth date/time:  03/22/2018 07:59:00 Delivery type:  Vaginal, Spontaneous     Baby Feeding: Breast Disposition:home with mother   03/23/2018 Kenney Houseman, CNM

## 2018-03-28 ENCOUNTER — Inpatient Hospital Stay (HOSPITAL_COMMUNITY): Payer: Medicaid Other

## 2019-11-02 IMAGING — US US MFM UA CORD DOPPLER
1 series · 13 of 28 positions shown · non-contrast
Comparison: none

[Series 1: us mfm ua cord doppler · 13 of 74 slices shown]
[im 3/74]
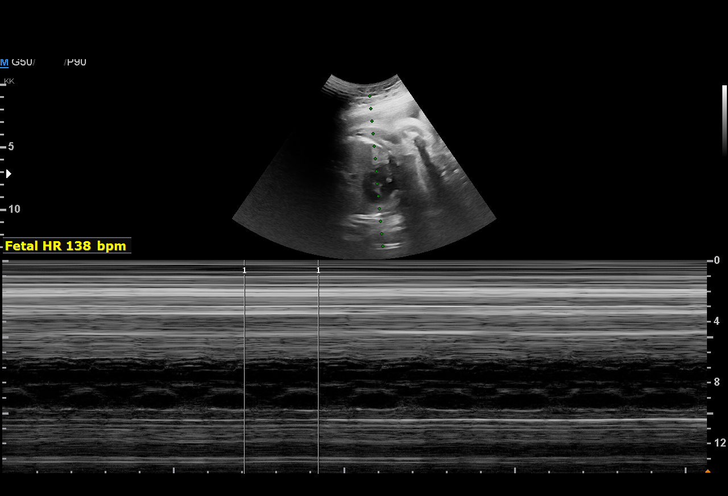
[im 9/74]
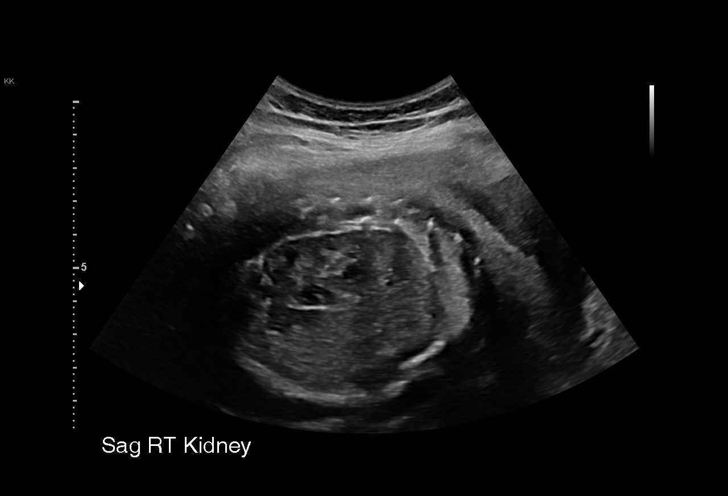
[im 14/74]
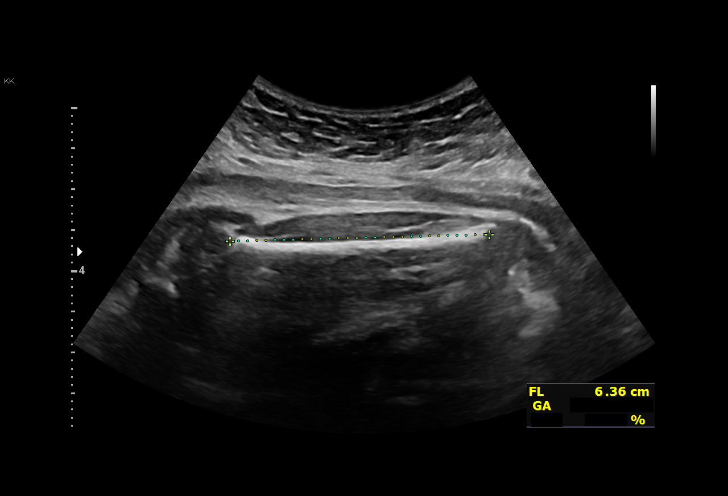
[im 19/74]
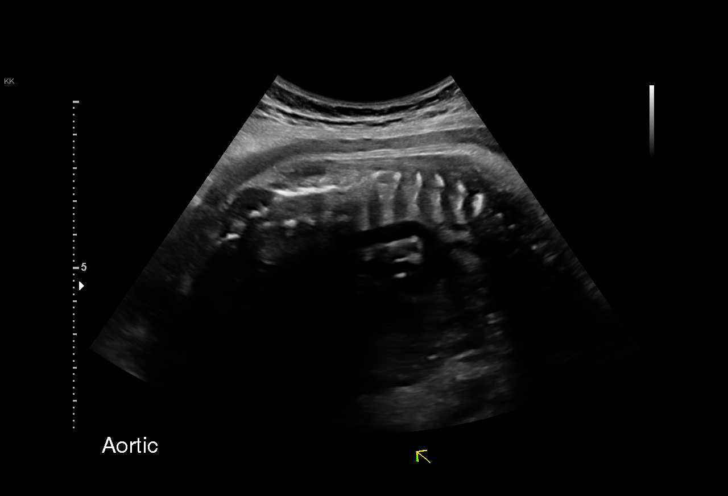
[im 25/74]
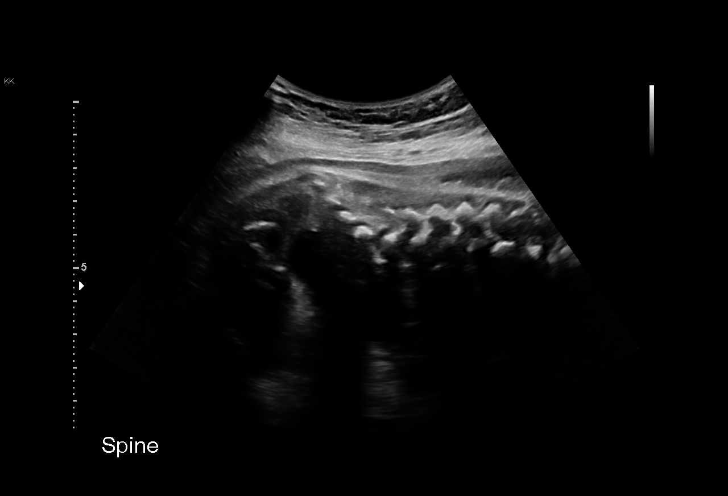
[im 30/74]
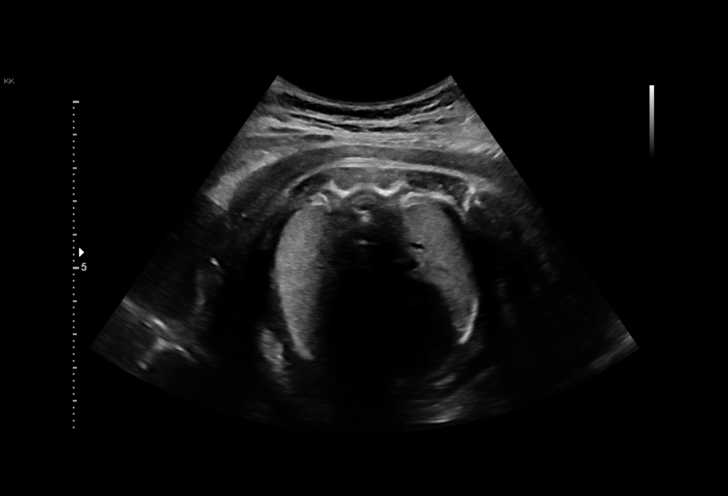
[im 38/74]
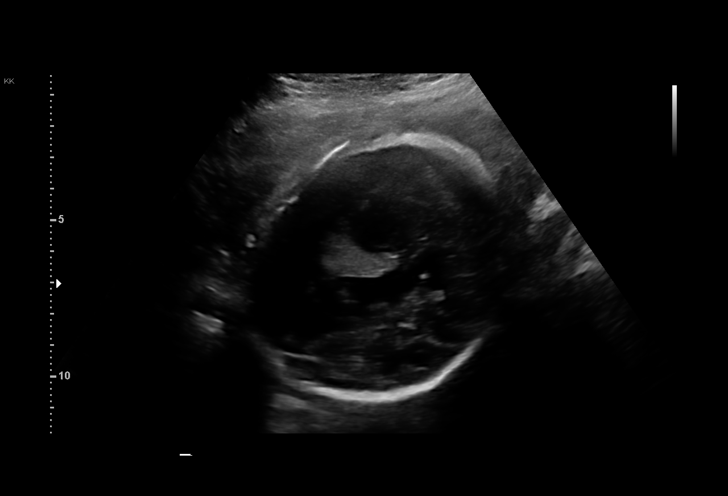
[im 44/74]
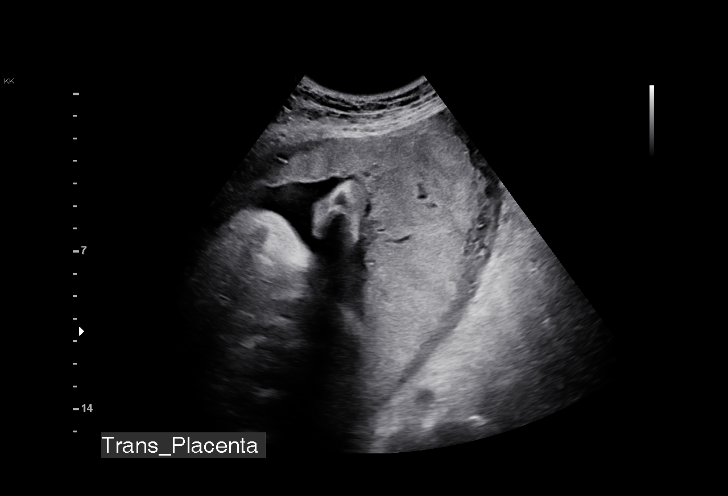
[im 49/74]
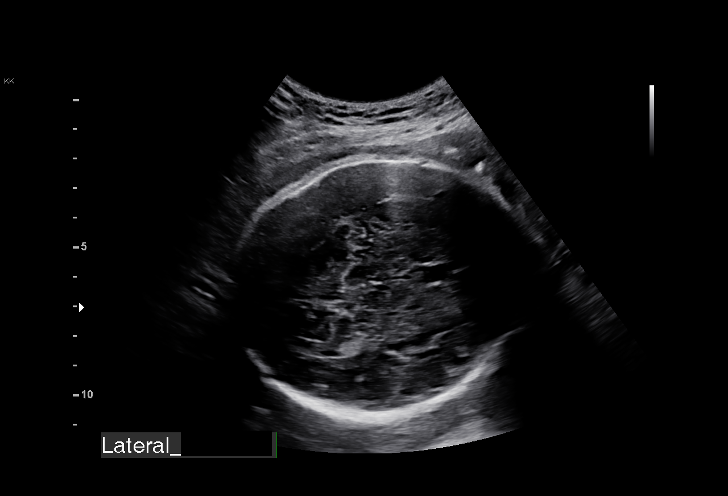
[im 55/74]
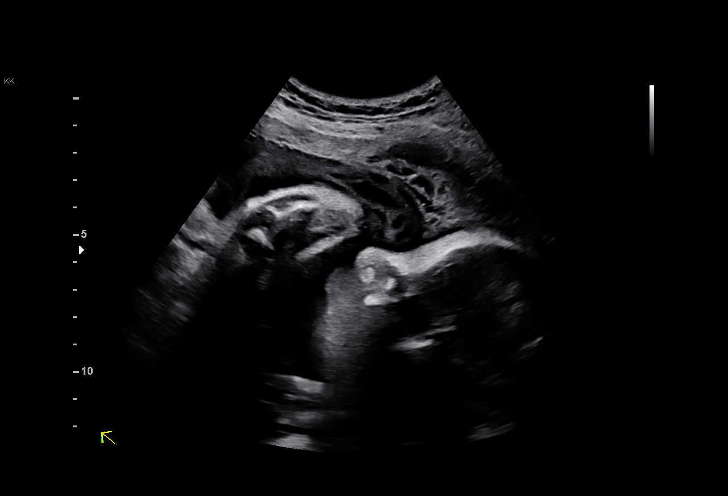
[im 60/74]
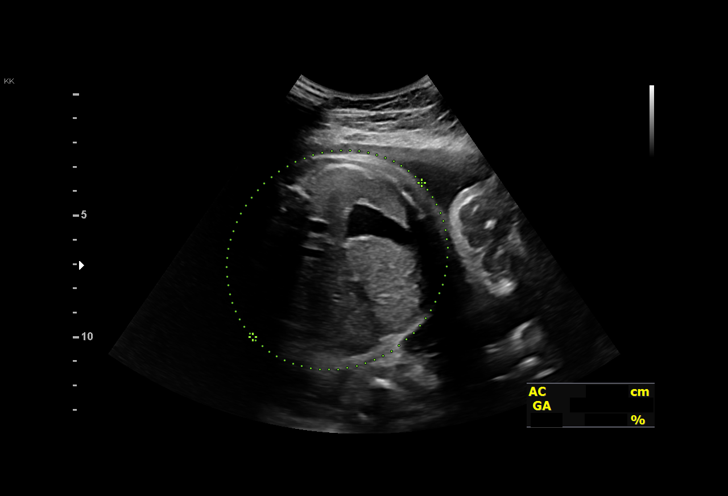
[im 65/74]
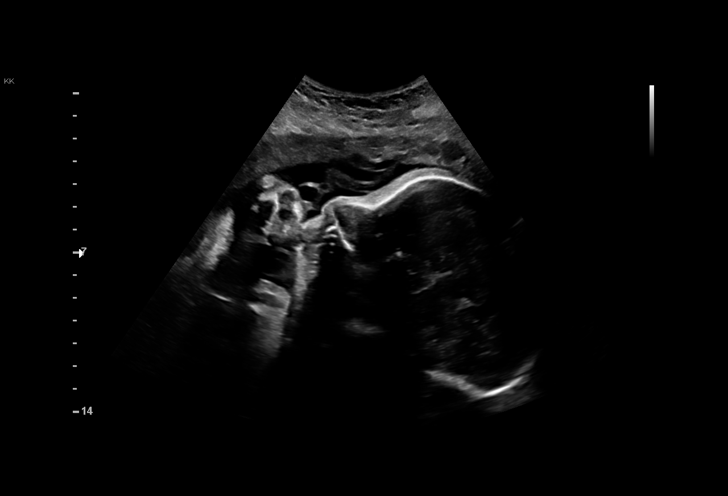
[im 71/74]
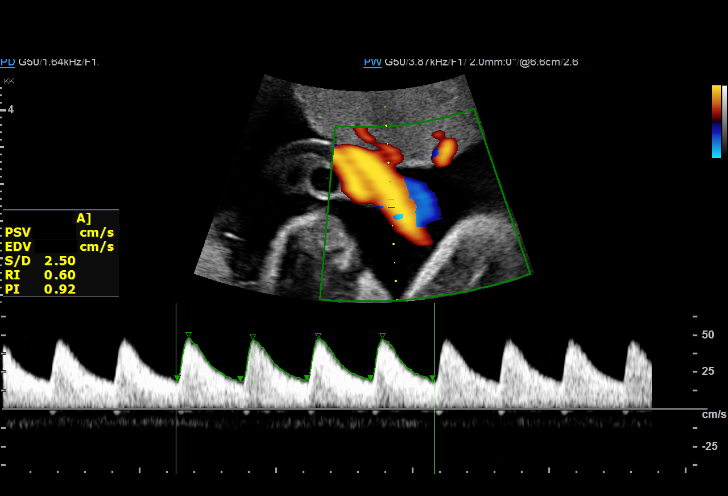

[13 of 28 positions shown; findings below may reference images not displayed]

1  US MFM OB DETAIL +14 WK               76811.01     ERKO MERA DALMATIN
  2  US MFM UA CORD DOPPLER                76820.02     ERKO MERA DALMATIN
 ----------------------------------------------------------------------

 ----------------------------------------------------------------------
Indications

  Encounter for antenatal screening for
  malformations
  Uterine size-date discrepancy, third trimester
  33 weeks gestation of pregnancy
 ----------------------------------------------------------------------
Vital Signs

 BMI:
Fetal Evaluation

 Num Of Fetuses:          1
 Fetal Heart Rate(bpm):   138
 Cardiac Activity:        Observed
 Presentation:            Cephalic
 Placenta:                Left lateral
 P. Cord Insertion:       Not well visualized

 Amniotic Fluid
 AFI FV:      Within normal limits

 AFI Sum(cm)     %Tile       Largest Pocket(cm)
 12.17           34

 RUQ(cm)       RLQ(cm)        LUQ(cm)        LLQ(cm)

Biometry

 BPD:      83.1   mm     G. Age:  33w 3d         57  %    CI:         77.74  %    70 - 86
                                                          FL/HC:       20.9  %    19.9 -
 HC:      298.3   mm     G. Age:  33w 0d         17  %    HC/AC:       1.09       0.96 -
 AC:      274.9   mm     G. Age:  31w 4d         15  %    FL/BPD:      75.1  %    71 - 87
 FL:       62.4   mm     G. Age:  32w 2d         22  %    FL/AC:       22.7  %    20 - 24

 Est. FW:    4494   gm      4 lb 3 oz     41  %
OB History

 Gravidity:     3         Term:  2
 Living:        2
Gestational Age

 LMP:            33w 0d       Date:  07/05/17                   EDD:  04/11/18
 U/S Today:      32w 4d                                         EDD:  04/14/18
 Best:           33w 0d    Det. By:  LMP  (07/05/17)            EDD:  04/11/18
Anatomy

 Cranium:                Appears normal         Aortic Arch:            Appears normal
 Cavum:                  Appears normal         Ductal Arch:            Not well visualized
 Ventricles:             Appears normal         Diaphragm:              Not well visualized
 Choroid Plexus:         Appears normal         Stomach:                Appears normal, left
                                                                        sided
 Cerebellum:             Appears normal         Abdomen:                Appears normal
 Posterior Fossa:        Appears normal         Abdominal Wall:         Not well visualized
 Nuchal Fold:            Not applicable (>20    Cord Vessels:           Appears normal (3
                         wks GA)                                        vessel cord)
 Face:                   Appears normal         Kidneys:                Appear normal
                         (orbits and profile)
 Lips:                   Not well visualized    Bladder:                Appears normal
 Thoracic:               Appears normal         Spine:                  Limited views
                                                                        appear normal
 Heart:                  Appears normal         Upper Extremities:      Not well visualized
                         (4CH, axis, and situs)
 RVOT:                   Not well visualized    Lower Extremities:      Not well visualized
 LVOT:                   Not well visualized

 Other:   Technically difficult due to fetal position.
Doppler - Fetal Vessels

 Umbilical Artery
  S/D     %tile                                             ADFV    RDFV
 2.64        52                                                No      No

Cervix Uterus Adnexa

 Cervix
 Not visualized (advanced GA >26wks)
Comments

 I met with Ms. Banon and reviewed today's examination. We
 note the outside examination suspicious for IUGR. Today the
 fetus is normally grown with a BPP [DATE] and normal fluid and UA
 dopplers.

 I reviewed her history she has no risk factors for IUGR,
 including chronic conditions, medication use, family history or
 substance abuse.  She had a low risk NIPS as well.

 Her vitals were 116/69 pulse 100 and weight 409lbs.

 At this time I reviewed the images and todays finding of an
 appropriately grown fetus.

 I recommended follow up growth at your offices or ours in 4
 weeks.

 I spent 15 minutes with > 50% in face to face consultation and
 care coordination.
Impression

 Normal interval growth.
 Subtoptimal view of some anatomy given fetal gestational age.
Recommendations

 Consider repeat growth in 4 weeks (this was not scheduled
 today, we are happy to see her as well).

## 2022-07-03 ENCOUNTER — Emergency Department (HOSPITAL_COMMUNITY)
Admission: EM | Admit: 2022-07-03 | Discharge: 2022-07-03 | Disposition: A | Discharge status: Home / Self Care | Payer: BLUE CROSS/BLUE SHIELD | Attending: Emergency Medicine | Admitting: Emergency Medicine

## 2022-07-03 ENCOUNTER — Emergency Department (HOSPITAL_COMMUNITY): Payer: BLUE CROSS/BLUE SHIELD

## 2022-07-03 ENCOUNTER — Encounter (HOSPITAL_COMMUNITY): Payer: Self-pay | Admitting: Emergency Medicine

## 2022-07-03 ENCOUNTER — Other Ambulatory Visit: Payer: Self-pay

## 2022-07-03 DIAGNOSIS — R0781 Pleurodynia: Secondary | ICD-10-CM | POA: Diagnosis present

## 2022-07-03 DIAGNOSIS — R072 Precordial pain: Secondary | ICD-10-CM

## 2022-07-03 LAB — CBC
HCT: 38.3 % (ref 36.0–46.0)
Hemoglobin: 12.2 g/dL (ref 12.0–15.0)
MCH: 28.2 pg (ref 26.0–34.0)
MCHC: 31.9 g/dL (ref 30.0–36.0)
MCV: 88.5 fL (ref 80.0–100.0)
Platelets: 185 10*3/uL (ref 150–400)
RBC: 4.33 MIL/uL (ref 3.87–5.11)
RDW: 13.2 % (ref 11.5–15.5)
WBC: 5.6 10*3/uL (ref 4.0–10.5)
nRBC: 0 % (ref 0.0–0.2)

## 2022-07-03 LAB — BASIC METABOLIC PANEL
Anion gap: 7 (ref 5–15)
BUN: 17 mg/dL (ref 6–20)
CO2: 21 mmol/L — ABNORMAL LOW (ref 22–32)
Calcium: 8.8 mg/dL — ABNORMAL LOW (ref 8.9–10.3)
Chloride: 108 mmol/L (ref 98–111)
Creatinine, Ser: 0.63 mg/dL (ref 0.44–1.00)
GFR, Estimated: >60 mL/min (ref >60)
Glucose, Bld: 101 mg/dL — ABNORMAL HIGH (ref 70–99)
Potassium: 4 mmol/L (ref 3.5–5.1)
Sodium: 136 mmol/L (ref 135–145)

## 2022-07-03 LAB — TROPONIN I (HIGH SENSITIVITY)
Troponin I (High Sensitivity): <2 ng/L
Troponin I (High Sensitivity): <2 ng/L

## 2022-07-03 LAB — HCG, SERUM, QUALITATIVE: Preg, Serum: NEGATIVE

## 2022-07-03 MED ORDER — FAMOTIDINE 20 MG PO TABS: 20.0000 mg | ORAL_TABLET | Freq: Two times a day (BID) | ORAL | 0 refills | Status: AC

## 2022-07-03 NOTE — ED Provider Notes (Signed)
[facility]MENT AT [facility] Provider Note   CSN: [id number] Arrival date & time: 07/03/22  0038     History  Chief Complaint  Patient presents with   Chest Pain    Dawn Torres is a 32 y.o. female.  HPI   Patient without significant medical history presented with complaints of chest pain, states has been gone for about a week, states it is episodic, typically come on at nighttime, last about an hour then resolved, cannot describe the pain but states she will feel in her chest on occasion go into her back, no associated nausea vomiting lightheadedness near syncope no becoming diaphoretic, no associated pleuritic chest pain or shortness of breath.  Currently has no pain at this time, pain resolved on its own without taking any medications, she has no cardiac history, no history of PEs or DVTs on oral birth control, no family history of cardiac abnormalities, denies history of dissections, aneurysms, connective tissue disorders.  Currently has no complaints.  Denies any calf pain calf tenderness leg swelling.  Interpreter was used to collect HPI  Home Medications Prior to Admission medications   Medication Sig Start Date End Date Taking? Authorizing Provider  famotidine (PEPCID) 20 MG tablet Take 1 tablet (20 mg total) by mouth 2 (two) times daily. 07/03/22  Yes Carroll Sage, PA-C  ibuprofen (ADVIL,MOTRIN) 600 MG tablet Take 1 tablet (600 mg total) by mouth every 6 (six) hours. 03/23/18   Prothero, [place] Newcomer, CNM  Prenatal Vit-Fe Fumarate-FA (PRENATAL MULTIVITAMIN) TABS tablet Take 1 tablet by mouth daily at 12 noon.    [provider]      Allergies    Patient has no known allergies.    Review of Systems   Review of Systems  Constitutional:  Negative for chills and fever.  Respiratory:  Negative for shortness of breath.   Cardiovascular:  Negative for chest pain.  Gastrointestinal:  Negative for abdominal pain.  Neurological:  Negative  for headaches.    Physical Exam Updated Vital Signs BP 129/80 (BP Location: Left Arm)   Pulse 76   Temp 99.1 F (37.3 C) (Oral)   Resp 18   SpO2 100%   Breastfeeding Unknown  Physical Exam Vitals and nursing note reviewed.  Constitutional:      General: She is not in acute distress.    Appearance: She is not ill-appearing.  HENT:     Head: Normocephalic and atraumatic.     Nose: No congestion.  Eyes:     Conjunctiva/sclera: Conjunctivae normal.  Cardiovascular:     Rate and Rhythm: Normal rate and regular rhythm.     Pulses: Normal pulses.     Heart sounds: No murmur heard.    No friction rub. No gallop.  Pulmonary:     Effort: No respiratory distress.     Breath sounds: No wheezing, rhonchi or rales.  Musculoskeletal:     Right lower leg: No edema.     Left lower leg: No edema.     Comments: No unilateral leg swelling no calf tenderness no palpable cords.  Skin:    General: Skin is warm and dry.  Neurological:     Mental Status: She is alert.  Psychiatric:        Mood and Affect: Mood normal.     ED Results / Procedures / Treatments   Labs (all labs ordered are listed, but only abnormal results are displayed) Labs Reviewed  BASIC METABOLIC PANEL - Abnormal; Notable  for the following components:      Result Value   CO2 21 (*)    Glucose, Bld 101 (*)    Calcium 8.8 (*)    All other components within normal limits  CBC  HCG, SERUM, QUALITATIVE  TROPONIN I (HIGH SENSITIVITY)  TROPONIN I (HIGH SENSITIVITY)    EKG EKG Interpretation Date/Time:  Tuesday July 03 2022 00:56:23 EDT Ventricular Rate:  80 PR Interval:  139 QRS Duration:  84 QT Interval:  352 QTC Calculation: 406 R Axis:   80  Text Interpretation: Sinus rhythm Confirmed by Gloris Manchester [phone]) on 07/03/2022 1:05:27 AM  Radiology DG Chest 2 View  Result Date: 07/03/2022 CLINICAL DATA:  Chest pain EXAM: CHEST - 2 VIEW COMPARISON:  None Available. FINDINGS: The heart size and mediastinal contours  are within normal limits. Both lungs are clear. The visualized skeletal structures are unremarkable. IMPRESSION: No active cardiopulmonary disease. Electronically Signed   By: Helyn Numbers M.D.   On: 07/03/2022 01:12    Procedures Procedures    Medications Ordered in ED Medications - No data to display  ED Course/ Medical Decision Making/ A&P                             Medical Decision Making Amount and/or Complexity of Data Reviewed Labs: ordered. Radiology: ordered.   This patient presents to the ED for concern of chest pain, this involves an extensive number of treatment options, and is a complaint that carries with it a high risk of complications and morbidity.  The differential diagnosis includes PE, dissection, ACS, pneumonia,    Additional history obtained:  Additional history obtained from partner at bedside External records from outside source obtained and reviewed including ob notes   Co morbidities that complicate the patient evaluation  N/A  Social Determinants of Health:  None English speaking    Lab Tests:  I Ordered, and personally interpreted labs.  The pertinent results include: CBC unremarkable, BMP reveals CO2 of 21 glucose 101 calcium 8.8 hCG negative, for troponin is negative,   Imaging Studies ordered:  I ordered imaging studies including chest x-ray I independently visualized and interpreted imaging which showed negative I agree with the radiologist interpretation   Cardiac Monitoring:  The patient was maintained on a cardiac monitor.  I personally viewed and interpreted the cardiac monitored which showed an underlying rhythm of: EKG without signs of ischemia   Medicines ordered and prescription drug management:  I ordered medication including N/A I have reviewed the patients home medicines and have made adjustments as needed  Critical Interventions:  N/A   Reevaluation:  Presents with chest pain which has spontaneous  resolved, without any intervention, triage obtain basic lab workup imaging which I personally viewed is unremarkable, will obtain second troponin and reassess.  Reassessed resting comfortably having no complaints agreement discharge at this time    Consultations Obtained:  N/A    Test Considered:  CT dissection study-defers my suspicion for dissection/aneurysm is very low at this time, patient has low risk factors, presentation is extremely atypical, pain is intermittent, ongoing, and nighttime, result resolved spontaneously without any interventions.    Rule out I have low suspicion for ACS as history is atypical, patient has no cardiac history, EKG was sinus rhythm without signs of ischemia, patient has a negative delta troponin.  Low suspicion for PE as patient denies pleuritic chest pain, shortness of breath, patient denies leg pain, no  pedal edema noted on exam, vital signs reassuring nontachypneic nonhypoxic.  Similar to dissection presentation is atypical, pain is typically at nighttime, without pleuritic chest pain or shortness of breath, pain resolved without any intervention.  Low suspicion for systemic infection as patient is nontoxic-appearing, vital signs reassuring, no obvious source infection noted on exam.     Dispostion and problem list  After consideration of the diagnostic results and the patients response to treatment, I feel that the patent would benefit from discharge.  Precordial chest pain-since resolved, unclear etiology, differential includes costochondritis, anxiety, GERD, musculoskeletal, will recommend she continue with ibuprofen, add on Pepcid, follow-up with PCP for for further assessment.            Final Clinical Impression(s) / ED Diagnoses Final diagnoses:  Precordial chest pain    Rx / DC Orders ED Discharge Orders          Ordered    famotidine (PEPCID) 20 MG tablet  2 times daily        07/03/22 0427               Carroll Sage, PA-C 07/03/22 0429    Gloris Manchester, MD 07/03/22 [phone]

## 2022-07-03 NOTE — Discharge Instructions (Addendum)
Your lab workup and exam were reassuring, I would like you to start taking ibuprofen as needed for pain, have given you a medication called Pepcid this will help with acid reflux.  Please follow-up with your primary doctor for reassessment and may follow-up with community health and wellness.  Come back to the emergency department if you develop chest pain, shortness of breath, severe abdominal pain, uncontrolled nausea, vomiting, diarrhea.

## 2022-07-03 NOTE — ED Triage Notes (Signed)
Pt c/o intermittent chest pain x 1 week. This episode started last night 2230.

## 2023-06-25 ENCOUNTER — Other Ambulatory Visit (HOSPITAL_COMMUNITY)
Admission: RE | Admit: 2023-06-25 | Discharge: 2023-06-25 | Disposition: A | Discharge status: Home / Self Care | Payer: Self-pay | Source: Ambulatory Visit | Attending: Obstetrics and Gynecology | Admitting: Obstetrics and Gynecology

## 2023-06-25 ENCOUNTER — Ambulatory Visit: Payer: Self-pay | Admitting: Obstetrics and Gynecology

## 2023-06-25 ENCOUNTER — Encounter: Payer: Self-pay | Admitting: Obstetrics and Gynecology

## 2023-06-25 VITALS — BP 121/78 | HR 78 | Ht 61.0 in | Wt 150.0 lb

## 2023-06-25 DIAGNOSIS — Z01419 Encounter for gynecological examination (general) (routine) without abnormal findings: Secondary | ICD-10-CM

## 2023-06-25 DIAGNOSIS — Z3046 Encounter for surveillance of implantable subdermal contraceptive: Secondary | ICD-10-CM

## 2023-06-25 DIAGNOSIS — Z1331 Encounter for screening for depression: Secondary | ICD-10-CM

## 2023-06-25 NOTE — Progress Notes (Addendum)
 33 y.o. New GYN presents for AEX/PAP/ Nexplanon Removal.  C/o getting her period every two weeks.

## 2023-06-25 NOTE — Progress Notes (Signed)
 Subjective:     Dawn Torres is a 33 y.o. female P3 with LMP 05/27/23 and BMI 28 who is here for a comprehensive physical exam. The patient reports irregular bleeding for the past 6 months with Nexplanon and is requesting Nexplanon removal. Patient is not interested in contraception at this time as she desires to conceive. Patient is sexually active without complaints. She denies pelvic pain or abnormal discharge. Patient is without any other complaints  Past Medical History:  Diagnosis Date   Hemorrhoids    Medical history non-contributory    TMJ (dislocation of temporomandibular joint)    Past Surgical History:  Procedure Laterality Date   NO PAST SURGERIES     No family history on file.  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 Arrived in the [place]  01/2014 with her infant daughter, to join her husband, already living here.   Social Drivers of Corporate investment banker Strain: Low Risk  (03/19/2018)   Overall Financial Resource Strain (CARDIA)    Difficulty of Paying Living Expenses: Not hard at all  Food Insecurity: No Food Insecurity (03/19/2018)   Hunger Vital Sign    Worried About Running Out of Food in the Last Year: Never true    Ran Out of Food in the Last Year: Never true  Transportation Needs: Unknown (03/19/2018)   PRAPARE - Administrator, Civil Service (Medical): No    Lack of Transportation (Non-Medical): Not on  file  Physical Activity: Not on file  Stress: Not on file  Social Connections: Not on file  Intimate Partner Violence: Not on file   Health Maintenance  Topic Date Due   Hepatitis C Screening  Never done   DTaP/Tdap/Td (1 - Tdap) Never done   Hepatitis B Vaccines (1 of 3 - 19+ 3-dose series) Never done   HPV VACCINES (1 - 3-dose SCDM series) Never done   Cervical Cancer Screening (HPV/Pap Cotest)  Never done   COVID-19 Vaccine (1 - 2024-25 season) Never done   INFLUENZA VACCINE  08/02/2023   HIV Screening  Completed   Meningococcal B Vaccine  Aged Out       Review of Systems Pertinent items noted in HPI and remainder of comprehensive ROS otherwise negative.   Objective:  Blood pressure 121/78, pulse 78, height 5' 1 (1.549 m), weight 150 lb (68 kg), last menstrual period 05/27/2023, unknown if currently breastfeeding.   GENERAL: Well-developed, well-nourished female in no acute distress.  HEENT: Normocephalic, atraumatic. Sclerae anicteric.  NECK: Supple. Normal thyroid.  LUNGS: Clear to auscultation bilaterally.  HEART: Regular rate and rhythm. BREASTS: Symmetric in size. No palpable masses or lymphadenopathy, skin changes, or nipple drainage. ABDOMEN: Soft, nontender, nondistended. No organomegaly. PELVIC: Normal external female genitalia. Vagina is pink and rugated.  Normal discharge. Normal appearing cervix. Uterus is normal in size. No  adnexal mass or tenderness. Chaperone present during the pelvic exam EXTREMITIES: No cyanosis, clubbing, or edema, 2+ distal pulses.     Assessment:    Healthy female exam.      Plan:    Pap smear collected Patient will be contacted with abnormal results  Nexplanon Removal Patient given informed consent for removal of her Nexplanon, time out was performed.  Signed copy in the chart.  Appropriate time out taken. Nexplanon site identified.  Area prepped in usual sterile fashon. One cc of 1% lidocaine  was used to anesthetize the area  at the distal end of the implant. A small stab incision was made right beside the implant on the distal portion.  The Nexplanon rod was grasped using hemostats and removed without difficulty.  There was less than 3 cc blood loss. There were no complications.  Steri-strips were applied over the small incision.  A pressure bandage was applied to reduce any bruising.  The patient tolerated the procedure well and was given post procedure instructions.  Patient advised to start prenatal vitamins  See After Visit Summary for Counseling Recommendations

## 2023-06-27 LAB — CYTOLOGY - PAP
Comment: NEGATIVE
Diagnosis: NEGATIVE
High risk HPV: NEGATIVE
